# Patient Record
Sex: Female | Born: 1995 | Hispanic: Yes | Marital: Single | Smoking: Never smoker
Health system: Southern US, Community
[De-identification: ages and names within clinical notes are randomized; demographics above are authoritative.]

## PROBLEM LIST (undated history)

## (undated) DIAGNOSIS — O321XX0 Maternal care for breech presentation, not applicable or unspecified: Secondary | ICD-10-CM

---

## 2020-12-06 ENCOUNTER — Inpatient Hospital Stay: Payer: Self-pay | Admitting: Anesthesiology

## 2020-12-06 ENCOUNTER — Encounter: Payer: Self-pay | Admitting: Obstetrics and Gynecology

## 2020-12-06 ENCOUNTER — Encounter
Admission: EM | Disposition: A | Discharge status: Home / Self Care | Payer: Self-pay | Source: Home / Self Care | Attending: Obstetrics and Gynecology

## 2020-12-06 ENCOUNTER — Inpatient Hospital Stay
Admission: EM | Admit: 2020-12-06 | Discharge: 2020-12-14 | DRG: 787 | Disposition: A | Discharge status: Home / Self Care | Payer: Self-pay | Attending: Obstetrics and Gynecology | Admitting: Obstetrics and Gynecology

## 2020-12-06 ENCOUNTER — Other Ambulatory Visit: Payer: Self-pay

## 2020-12-06 DIAGNOSIS — Z3A35 35 weeks gestation of pregnancy: Secondary | ICD-10-CM

## 2020-12-06 DIAGNOSIS — Z23 Encounter for immunization: Secondary | ICD-10-CM

## 2020-12-06 DIAGNOSIS — O34211 Maternal care for low transverse scar from previous cesarean delivery: Secondary | ICD-10-CM | POA: Diagnosis present

## 2020-12-06 DIAGNOSIS — R531 Weakness: Secondary | ICD-10-CM | POA: Diagnosis not present

## 2020-12-06 DIAGNOSIS — O34219 Maternal care for unspecified type scar from previous cesarean delivery: Secondary | ICD-10-CM | POA: Diagnosis present

## 2020-12-06 DIAGNOSIS — Z20822 Contact with and (suspected) exposure to covid-19: Secondary | ICD-10-CM | POA: Diagnosis present

## 2020-12-06 DIAGNOSIS — Z9889 Other specified postprocedural states: Secondary | ICD-10-CM

## 2020-12-06 DIAGNOSIS — O9081 Anemia of the puerperium: Secondary | ICD-10-CM | POA: Diagnosis not present

## 2020-12-06 DIAGNOSIS — O99893 Other specified diseases and conditions complicating puerperium: Secondary | ICD-10-CM | POA: Diagnosis not present

## 2020-12-06 DIAGNOSIS — D62 Acute posthemorrhagic anemia: Secondary | ICD-10-CM | POA: Diagnosis not present

## 2020-12-06 DIAGNOSIS — Z711 Person with feared health complaint in whom no diagnosis is made: Secondary | ICD-10-CM

## 2020-12-06 DIAGNOSIS — M21379 Foot drop, unspecified foot: Secondary | ICD-10-CM | POA: Diagnosis not present

## 2020-12-06 DIAGNOSIS — R200 Anesthesia of skin: Secondary | ICD-10-CM | POA: Diagnosis not present

## 2020-12-06 DIAGNOSIS — O42913 Preterm premature rupture of membranes, unspecified as to length of time between rupture and onset of labor, third trimester: Principal | ICD-10-CM | POA: Diagnosis present

## 2020-12-06 LAB — RESP PANEL BY RT-PCR (FLU A&B, COVID) ARPGX2
Influenza A by PCR: NEGATIVE
Influenza B by PCR: NEGATIVE
SARS Coronavirus 2 by RT PCR: NEGATIVE

## 2020-12-06 LAB — CBC
HCT: 29.2 % — ABNORMAL LOW (ref 36.0–46.0)
Hemoglobin: 9.7 g/dL — ABNORMAL LOW (ref 12.0–15.0)
MCH: 30.4 pg (ref 26.0–34.0)
MCHC: 33.2 g/dL (ref 30.0–36.0)
MCV: 91.5 fL (ref 80.0–100.0)
Platelets: 287 10*3/uL (ref 150–400)
RBC: 3.19 MIL/uL — ABNORMAL LOW (ref 3.87–5.11)
RDW: 12.9 % (ref 11.5–15.5)
WBC: 9.3 10*3/uL (ref 4.0–10.5)
nRBC: 0 % (ref 0.0–0.2)

## 2020-12-06 LAB — OB RESULTS CONSOLE GC/CHLAMYDIA
Chlamydia: NEGATIVE
Gonorrhea: NEGATIVE

## 2020-12-06 LAB — RPR: RPR Ser Ql: NONREACTIVE

## 2020-12-06 LAB — TYPE AND SCREEN
ABO/RH(D): A POS
Antibody Screen: NEGATIVE

## 2020-12-06 LAB — OB RESULTS CONSOLE HIV ANTIBODY (ROUTINE TESTING): HIV: NONREACTIVE

## 2020-12-06 LAB — CHLAMYDIA/NGC RT PCR (ARMC ONLY)
Chlamydia Tr: NOT DETECTED
N gonorrhoeae: NOT DETECTED

## 2020-12-06 LAB — HEPATITIS B SURFACE ANTIGEN: Hepatitis B Surface Ag: NONREACTIVE

## 2020-12-06 LAB — RAPID HIV SCREEN (HIV 1/2 AB+AG)
HIV 1/2 Antibodies: NONREACTIVE
HIV-1 P24 Antigen - HIV24: NONREACTIVE

## 2020-12-06 LAB — GROUP B STREP BY PCR: Group B strep by PCR: NEGATIVE

## 2020-12-06 LAB — ABO/RH: ABO/RH(D): A POS

## 2020-12-06 SURGERY — Surgical Case
Anesthesia: Spinal

## 2020-12-06 MED ORDER — TETANUS-DIPHTH-ACELL PERTUSSIS 5-2.5-18.5 LF-MCG/0.5 IM SUSY
0.5000 mL | PREFILLED_SYRINGE | Freq: Once | INTRAMUSCULAR | Status: DC
  Filled 2020-12-06: qty 0.5

## 2020-12-06 MED ORDER — ACETAMINOPHEN 500 MG PO TABS: 1000.0000 mg | ORAL_TABLET | Freq: Four times a day (QID) | ORAL | Status: DC | PRN

## 2020-12-06 MED ORDER — ONDANSETRON HCL 4 MG/2ML IJ SOLN: 4.0000 mg | Freq: Four times a day (QID) | INTRAMUSCULAR | Status: DC | PRN

## 2020-12-06 MED ORDER — SODIUM CHLORIDE 0.9 % IV SOLN
INTRAVENOUS | Status: DC | PRN
  Administered 2020-12-06: 110 mL

## 2020-12-06 MED ORDER — ACETAMINOPHEN 500 MG PO TABS
1000.0000 mg | ORAL_TABLET | Freq: Four times a day (QID) | ORAL | Status: DC
  Administered 2020-12-06 – 2020-12-14 (×25): 1000 mg via ORAL
  Filled 2020-12-06 (×28): qty 2

## 2020-12-06 MED ORDER — LIDOCAINE HCL (PF) 1 % IJ SOLN: 30.0000 mL | INTRAMUSCULAR | Status: DC | PRN

## 2020-12-06 MED ORDER — OXYTOCIN-SODIUM CHLORIDE 30-0.9 UT/500ML-% IV SOLN
2.5000 [IU]/h | INTRAVENOUS | Status: DC
  Administered 2020-12-06: 30 [IU] via INTRAVENOUS
  Filled 2020-12-06: qty 1000

## 2020-12-06 MED ORDER — IBUPROFEN 800 MG PO TABS
800.0000 mg | ORAL_TABLET | Freq: Four times a day (QID) | ORAL | Status: DC
  Administered 2020-12-07 – 2020-12-14 (×24): 800 mg via ORAL
  Filled 2020-12-06 (×26): qty 1

## 2020-12-06 MED ORDER — DIBUCAINE (PERIANAL) 1 % EX OINT: 1.0000 "application " | TOPICAL_OINTMENT | CUTANEOUS | Status: DC | PRN

## 2020-12-06 MED ORDER — LACTATED RINGERS IV SOLN
INTRAVENOUS | Status: DC
Start: 2020-12-06 — End: 2020-12-06

## 2020-12-06 MED ORDER — LACTATED RINGERS IV SOLN: 500.0000 mL | INTRAVENOUS | Status: DC | PRN

## 2020-12-06 MED ORDER — BUPIVACAINE IN DEXTROSE 0.75-8.25 % IT SOLN
INTRATHECAL | Status: DC | PRN
  Administered 2020-12-06: 1.6 mL via INTRATHECAL

## 2020-12-06 MED ORDER — OXYTOCIN-SODIUM CHLORIDE 30-0.9 UT/500ML-% IV SOLN
2.5000 [IU]/h | INTRAVENOUS | Status: AC
  Administered 2020-12-06 – 2020-12-07 (×2): 2.5 [IU]/h via INTRAVENOUS
  Filled 2020-12-06 (×2): qty 500

## 2020-12-06 MED ORDER — SENNOSIDES-DOCUSATE SODIUM 8.6-50 MG PO TABS
2.0000 | ORAL_TABLET | Freq: Every day | ORAL | Status: DC
  Administered 2020-12-07 – 2020-12-13 (×4): 2 via ORAL
  Filled 2020-12-06 (×5): qty 2

## 2020-12-06 MED ORDER — SIMETHICONE 80 MG PO CHEW
80.0000 mg | CHEWABLE_TABLET | ORAL | Status: DC | PRN
  Administered 2020-12-14: 80 mg via ORAL

## 2020-12-06 MED ORDER — OXYTOCIN BOLUS FROM INFUSION: 333.0000 mL | Freq: Once | INTRAVENOUS | Status: DC

## 2020-12-06 MED ORDER — BUPIVACAINE LIPOSOME 1.3 % IJ SUSP
20.0000 mL | Freq: Once | INTRAMUSCULAR | Status: DC
  Filled 2020-12-06: qty 20

## 2020-12-06 MED ORDER — METHYLERGONOVINE MALEATE 0.2 MG/ML IJ SOLN
INTRAMUSCULAR | Status: AC
  Filled 2020-12-06: qty 1

## 2020-12-06 MED ORDER — WITCH HAZEL-GLYCERIN EX PADS: 1.0000 "application " | MEDICATED_PAD | CUTANEOUS | Status: DC | PRN

## 2020-12-06 MED ORDER — MORPHINE SULFATE (PF) 2 MG/ML IV SOLN
1.0000 mg | INTRAVENOUS | Status: DC | PRN
  Administered 2020-12-06: 2 mg via INTRAVENOUS
  Filled 2020-12-06: qty 1

## 2020-12-06 MED ORDER — DIPHENHYDRAMINE HCL 25 MG PO CAPS: 25.0000 mg | ORAL_CAPSULE | Freq: Four times a day (QID) | ORAL | Status: DC | PRN

## 2020-12-06 MED ORDER — FENTANYL CITRATE (PF) 100 MCG/2ML IJ SOLN
INTRAMUSCULAR | Status: AC
  Filled 2020-12-06: qty 2

## 2020-12-06 MED ORDER — SODIUM CHLORIDE 0.9 % IV SOLN
500.0000 mg | INTRAVENOUS | Status: DC
  Administered 2020-12-06: 500 mg via INTRAVENOUS
  Filled 2020-12-06 (×3): qty 500

## 2020-12-06 MED ORDER — PRENATAL MULTIVITAMIN CH
1.0000 | ORAL_TABLET | Freq: Every day | ORAL | Status: DC
  Administered 2020-12-07 – 2020-12-14 (×7): 1 via ORAL
  Filled 2020-12-06 (×7): qty 1

## 2020-12-06 MED ORDER — COCONUT OIL OIL
1.0000 "application " | TOPICAL_OIL | Status: DC | PRN
  Filled 2020-12-06: qty 120

## 2020-12-06 MED ORDER — SOD CITRATE-CITRIC ACID 500-334 MG/5ML PO SOLN
30.0000 mL | ORAL | Status: AC
  Administered 2020-12-06: 30 mL via ORAL
  Filled 2020-12-06: qty 15

## 2020-12-06 MED ORDER — FENTANYL CITRATE (PF) 100 MCG/2ML IJ SOLN
INTRAMUSCULAR | Status: DC | PRN
  Administered 2020-12-06: 20 ug via INTRATHECAL

## 2020-12-06 MED ORDER — ENOXAPARIN SODIUM 80 MG/0.8ML ~~LOC~~ SOLN
62.5000 mg | SUBCUTANEOUS | Status: DC
  Administered 2020-12-07 – 2020-12-13 (×7): 62.5 mg via SUBCUTANEOUS
  Filled 2020-12-06 (×9): qty 0.8

## 2020-12-06 MED ORDER — BUPIVACAINE HCL (PF) 0.5 % IJ SOLN
INTRAMUSCULAR | Status: AC
  Filled 2020-12-06: qty 30

## 2020-12-06 MED ORDER — OXYCODONE HCL 5 MG PO TABS
5.0000 mg | ORAL_TABLET | ORAL | Status: DC | PRN
  Administered 2020-12-10 – 2020-12-11 (×2): 5 mg via ORAL
  Filled 2020-12-06 (×2): qty 1

## 2020-12-06 MED ORDER — SIMETHICONE 80 MG PO CHEW
80.0000 mg | CHEWABLE_TABLET | Freq: Three times a day (TID) | ORAL | Status: DC
  Administered 2020-12-06 – 2020-12-14 (×8): 80 mg via ORAL
  Filled 2020-12-06 (×11): qty 1

## 2020-12-06 MED ORDER — KETOROLAC TROMETHAMINE 30 MG/ML IJ SOLN
30.0000 mg | Freq: Four times a day (QID) | INTRAMUSCULAR | Status: AC
  Administered 2020-12-06 – 2020-12-07 (×3): 30 mg via INTRAVENOUS
  Filled 2020-12-06 (×4): qty 1

## 2020-12-06 MED ORDER — SODIUM CHLORIDE 0.9% FLUSH
50.0000 mL | Freq: Once | INTRAVENOUS | Status: DC
  Filled 2020-12-06: qty 51

## 2020-12-06 MED ORDER — CARBOPROST TROMETHAMINE 250 MCG/ML IM SOLN
INTRAMUSCULAR | Status: AC
  Filled 2020-12-06: qty 1

## 2020-12-06 MED ORDER — MENTHOL 3 MG MT LOZG
1.0000 | LOZENGE | OROMUCOSAL | Status: DC | PRN
  Filled 2020-12-06: qty 9

## 2020-12-06 MED ORDER — ZOLPIDEM TARTRATE 5 MG PO TABS: 5.0000 mg | ORAL_TABLET | Freq: Every evening | ORAL | Status: DC | PRN

## 2020-12-06 MED ORDER — CEFAZOLIN SODIUM-DEXTROSE 2-4 GM/100ML-% IV SOLN
2.0000 g | INTRAVENOUS | Status: AC
  Administered 2020-12-06: 2 g via INTRAVENOUS
  Filled 2020-12-06: qty 100

## 2020-12-06 MED ORDER — ONDANSETRON HCL 4 MG/2ML IJ SOLN
INTRAMUSCULAR | Status: DC | PRN
  Administered 2020-12-06: 4 mg via INTRAVENOUS

## 2020-12-06 MED ORDER — GABAPENTIN 300 MG PO CAPS
300.0000 mg | ORAL_CAPSULE | Freq: Every day | ORAL | Status: DC
  Administered 2020-12-06 – 2020-12-14 (×9): 300 mg via ORAL
  Filled 2020-12-06 (×9): qty 1

## 2020-12-06 SURGICAL SUPPLY — 28 items
BARRIER ADHS 3X4 INTERCEED (GAUZE/BANDAGES/DRESSINGS) ×2 IMPLANT
CANISTER SUCT 3000ML PPV (MISCELLANEOUS) ×2 IMPLANT
CHLORAPREP W/TINT 26 (MISCELLANEOUS) ×2 IMPLANT
COVER WAND RF STERILE (DRAPES) ×2 IMPLANT
DRSG TELFA 3X8 NADH (GAUZE/BANDAGES/DRESSINGS) ×2 IMPLANT
ELECT CAUTERY BLADE 6.4 (BLADE) ×2 IMPLANT
ELECT REM PT RETURN 9FT ADLT (ELECTROSURGICAL) ×2
ELECTRODE REM PT RTRN 9FT ADLT (ELECTROSURGICAL) ×1 IMPLANT
GAUZE SPONGE 4X4 12PLY STRL (GAUZE/BANDAGES/DRESSINGS) ×2 IMPLANT
GLOVE SURG SYN 8.0 (GLOVE) ×2 IMPLANT
GOWN STRL REUS W/ TWL LRG LVL3 (GOWN DISPOSABLE) ×2 IMPLANT
GOWN STRL REUS W/ TWL XL LVL3 (GOWN DISPOSABLE) ×1 IMPLANT
GOWN STRL REUS W/TWL LRG LVL3 (GOWN DISPOSABLE) ×2
GOWN STRL REUS W/TWL XL LVL3 (GOWN DISPOSABLE) ×1
MANIFOLD NEPTUNE II (INSTRUMENTS) ×2 IMPLANT
NEEDLE HYPO 22GX1.5 SAFETY (NEEDLE) ×2 IMPLANT
NS IRRIG 1000ML POUR BTL (IV SOLUTION) ×2 IMPLANT
PACK C SECTION AR (MISCELLANEOUS) ×2 IMPLANT
PAD OB MATERNITY 4.3X12.25 (PERSONAL CARE ITEMS) ×2 IMPLANT
PAD PREP 24X41 OB/GYN DISP (PERSONAL CARE ITEMS) ×2 IMPLANT
STRAP SAFETY 5IN WIDE (MISCELLANEOUS) ×2 IMPLANT
SUCT VACUUM KIWI BELL (SUCTIONS) ×2 IMPLANT
SUT CHROMIC 1 CTX 36 (SUTURE) ×6 IMPLANT
SUT PLAIN GUT 0 (SUTURE) ×4 IMPLANT
SUT VIC AB 0 CT1 36 (SUTURE) ×4 IMPLANT
SYR 30ML LL (SYRINGE) ×4 IMPLANT
TAPE MEDIFIX FOAM 3 (GAUZE/BANDAGES/DRESSINGS) ×2 IMPLANT
TUBE CONNECTING 6X3/16 (MISCELLANEOUS) ×2 IMPLANT

## 2020-12-06 NOTE — OB Triage Note (Signed)
Pt is a G3P1 from [place]. Pt has been in [place] for 2 days visiting family. Pt reports LOF around 4:00a. Pt report no bleeding, no N/VD. Pt is schedule for a C-section on Feb 2. Pt has had a pervious C-section in [place].

## 2020-12-06 NOTE — Progress Notes (Signed)
Dr. Pernell Dupre notified that Pt. States from the ankle down her feet are, "numb" bilat. Pt. Had stated she needed to have a B.M and when we attempted to assist her to the [facility], Pt. C/o numbness in feet. She can lift legs, move toes, flex and extend feet. Pedal pushes are equal ans sl. Weak. Positive pedal pulses equal and strong. Dr. Pernell Dupre states Pt. Is to be monitored for improvement by morning and if she still has the c/o numbness, Day Shift Anesthesia are to be notified.

## 2020-12-06 NOTE — Progress Notes (Signed)
   12/06/20 1638  Provider Notification  Provider Name/Title Pernell Dupre, MD  Method of Notification Phone  Request Evaluate - remote  Notification Reason Order request (pain, order verification)    Dr. Pernell Dupre, Anesthesiologist confirmed that the patient is able to have both Morphine and Roxicodone as ordered at any time.

## 2020-12-06 NOTE — Op Note (Signed)
NAME: Robyn Dunn, Robyn Dunn MEDICAL RECORD PI:[id number] ACCOUNT [id number] DATE OF BIRTH:08-07-1996 FACILITY: [facility] LOCATION: [facility] PHYSICIAN:Symphani Eckstrom Cloyde Reams, MD  OPERATIVE REPORT  DATE OF PROCEDURE:  12/06/2020  PREOPERATIVE DIAGNOSES: 1.  35+1 weeks estimated gestational age. 2.  Premature preterm rupture of membranes. 3.  Prior cesarean section, desires elective repeat.  POSTOPERATIVE DIAGNOSES: 1.  35+1 weeks estimated gestational age. 2.  Premature preterm rupture of membranes. 3.  Prior cesarean section, desires elective repeat. 4.  Vigorous female delivered.  PROCEDURE:  Repeat low transverse cesarean section.  ANESTHESIA:  Spinal.  SURGEON:  Jennell Corner, MD  FIRST ASSISTANT:  Margaretmary Eddy, certified nurse midwife.  INDICATIONS:  A 25 year old, gravida 3, para 1, at 35+1 weeks.  The patient is traveling from out of state visiting in [place] when she had spontaneous rupture of membranes at 0400 the day of the procedure.  The patient with a prior cesarean  section and has previously decided on elective repeat cesarean section and reconfirmed that desire the day of the procedure.  The patient denies opting for sterilization.  DESCRIPTION OF PROCEDURE:  After adequate spinal anesthesia, the patient was placed in dorsal supine position, hip roll on the right side.  The patient's abdomen was prepped and draped in normal sterile fashion.  The patient previously receive 2 grams IV  Ancef and 500 mg azithromycin for surgical prophylaxis.  Timeout was performed.  A Pfannenstiel incision was made 2 fingerbreadths above the symphysis pubis.  Sharp dissection was used to identify the fascia.  Fascia was opened in the midline and opened  in a transverse fashion.  Superior aspect of the fascia was grasped with Kocher clamps, and the recti muscles were dissected free.  Inferior aspect of the fascia was grasped with Kocher clamps and pyramidalis muscle was  dissected free.  Entry into the  peritoneal cavity was accomplished sharply.  There was no scar tissue identified.  The vesicouterine peritoneal fold was identified, opened, and the bladder was reflected inferiorly.  Low transverse uterine incision was made.  Upon entry into the  endometrial cavity, clear fluid resulted.  The incision was extended with blunt transverse traction.  A Kiwi bell vacuum was applied to the fetal occiput, and with one gentle pull, the head was delivered.  The vacuum was removed.  Shoulders and body were  delivered without difficulty.  A vigorous female was then dried on the abdomen for 60 seconds, and the cord was doubly clamped and infant was passed to nursery staff who assigned Apgar scores of 8 and 9.  Fetal weight 2250 grams.  Time of delivery 1149  on two 12/06/2020.  The placenta was manually delivered, and the uterus was exteriorized.  The endometrial cavity was wiped clean with laparotomy tape.  Uterine incision was then reapproximated with 1 chromic suture in a running locking fashion.  Good  approximation of edges.  Good hemostasis was noted.  Fallopian tubes and ovaries appeared normal.  Posterior cul-de-sac was irrigated and suctioned.  The uterus was placed back into the abdominal cavity and the pericolic gutters were wiped clean with  laparotomy tape.  Uterine incision again appeared hemostatic and Interceed was placed over the uterine incision in a T-shaped fashion.  Fascia was then closed with 0 Vicryl suture in a running nonlocking fashion.  Good approximation of edges.  Good  hemostasis was noted.  Subfascial edges were then injected with a solution of 20 mL of 1.3% Exparel plus 30 mL of 0.5% Marcaine and 50  mL normal saline.  40 mL of the solution was injected in the fascial plane.  Subcutaneous tissues were irrigated and  bovied for hemostasis, and given the subcutaneous tissues depth of 3 cm, the dead space was closed with a running 2-0 chromic suture.  Skin  was reapproximated with Insorb absorbable staples.  Good cosmetic effect.  Sterile dressing applied.  COMPLICATIONS:  There were no complications.  QUANTITATIVE BLOOD LOSS:  180 mL.  INTRAOPERATIVE FLUIDS:  500 mL.  URINE OUTPUT:  150 mL.  DISPOSITION:  The patient was taken to recovery room in good condition.  IN/NUANCE  D:12/06/2020 T:12/06/2020 JOB:[id number]

## 2020-12-06 NOTE — Progress Notes (Signed)
Patient tolerating crackers and water and peanut butter. Advanced to regular diet per order.

## 2020-12-06 NOTE — Brief Op Note (Signed)
12/06/2020  12:47 PM  PATIENT:  Robyn Dunn  24 y.o. female  PRE-OPERATIVE DIAGNOSIS:  Repeat Cesarean Section 35+1 weeks   PPROM  POST-OPERATIVE DIAGNOSIS:  Repeat Cesarean Section  PROCEDURE:  Procedure(s): CESAREAN SECTION LTCS SURGEON:  Surgeon(s) and Role:    * Henrine Hayter, Ihor Austin, MD - Primary  PHYSICIAN ASSISTANT: Mackey , CNM   ASSISTANTS: none   ANESTHESIA:   spinal  EBL:  QBL 180 mL   BLOOD ADMINISTERED:none  DRAINS: Urinary Catheter (Foley)   LOCAL MEDICATIONS USED:  MARCAINE    and BUPIVICAINE   SPECIMEN:  No Specimen  DISPOSITION OF SPECIMEN:  N/A  COUNTS:  YES  TOURNIQUET:  * No tourniquets in log *  DICTATION: .Other Dictation: Dictation Number verbal  PLAN OF CARE: Admit to inpatient   PATIENT DISPOSITION:  PACU - hemodynamically stable.   Delay start of Pharmacological VTE agent (>24hrs) due to surgical blood loss or risk of bleeding: not applicable

## 2020-12-06 NOTE — Transfer of Care (Signed)
Immediate Anesthesia Transfer of Care Note  Patient: Robyn Dunn  Procedure(s) Performed: CESAREAN SECTION  Patient Location: PACU and Nursing Unit  Anesthesia Type:Spinal  Level of Consciousness: awake  Airway & Oxygen Therapy: Patient Spontanous Breathing  Post-op Assessment: Report given to RN  Post vital signs: stable  Last Vitals:  Vitals Value Taken Time  BP    Temp    Pulse    Resp    SpO2      Last Pain:  Vitals:   12/06/20 0707  TempSrc: Oral  PainSc: 0-No pain         Complications: No complications documented.

## 2020-12-06 NOTE — Progress Notes (Signed)
Patient ID: Robyn Dunn, female   DOB: 1996-06-18, 25 y.o.   MRN: [id number] 35+1 weeks   Unassigned pt with ROM grossly at 0400 . Previous c/s and scheduled for repeat .Pt denies complications in this pregnancy .  Cesarean section risks and indications discuss with pt and translator present . All questions answered . Proceed . Peds has talked to her .

## 2020-12-06 NOTE — Discharge Summary (Signed)
Obstetrical Discharge Summary  Patient Name: Robyn Dunn DOB: 09/27/1996 MRN: [id number]  Date of Admission: 12/06/2020 Date of Delivery: 12/06/20 Delivered by: Huel Cote MD Date of Discharge:12/21/20  Primary OB: out of state - [place]  GBT:DVVOHYW'V last menstrual period was 04/04/2020. EDC Estimated Date of Delivery: 01/09/21 Gestational Age at Delivery: [redacted]w[redacted]d   Antepartum complications:none listed  Admitting Diagnosis: PPROM , prior c/s , desires repeat LTCS  Secondary Diagnosis:  Patient Active Problem List   Diagnosis Date Noted  . Person with feared complaint in whom no diagnosis is made 12/09/2020  . Preterm premature rupture of membranes in third trimester 12/06/2020  . Labor and delivery indication for care or intervention 12/06/2020  . Previous cesarean delivery affecting pregnancy 12/06/2020  . Postoperative state 12/06/2020    Augmentation: N/A Complications: None Intrapartum complications/course: Tnia presented to L&D with complaints of LOF.  She was grossly ruptured with large amount of clear fluid.  An elective repeat LTCS was performed by Dr. Ouida Sills.  See OP note for further details.    Date of Delivery: 12/06/2020 Delivered By: Huel Cote MD Delivery Type:repeat LTCS, vacuum assisted  Anesthesia: epidural Placenta: spontaneous Laceration: none  Episiotomy: none Newborn Data: Live born female  Birth Weight: 4 lb 15.4 oz (2250 g) APGAR: 8, 9  Newborn Delivery   Birth date/time: 12/06/2020 11:49:00 Delivery type: C-Section, Vacuum Assisted Trial of labor: No C-section categorization: Repeat     Postpartum Procedures: see below  Edinburgh:  Edinburgh Postnatal Depression Scale Screening Tool 12/13/2020 12/06/2020  I have been able to laugh and see the funny side of things. 1 0  I have looked forward with enjoyment to things. 1 0  I have blamed myself unnecessarily when things went wrong. 1 2  I have been anxious or worried for no  good reason. 1 2  I have felt scared or panicky for no good reason. 1 1  Things have been getting on top of me. 2 2  I have been so unhappy that I have had difficulty sleeping. 2 0  I have felt sad or miserable. 2 1  I have been so unhappy that I have been crying. 2 1  The thought of harming myself has occurred to me. 0 0  Edinburgh Postnatal Depression Scale Total 13 9    Post partum course: Complicated Patient had a complicated postpartum course.  Pt complained postop of bilateral LE weakness and numbness. Her postop course was uncomplicated from a obstetric standpoint. No trouble with toileting.  POD#1 Neurology was consulted on POD#1. They ordered an MRI, CMP, B12 and MMA, and gave empiric B12. MRI returned no acute findings in the spine.  Anesthesia also evaluated and found no anesthesia explanation for her sx.  POD#2, 12/08/20 Social work and physical therapy were consulted on POD#2. Neurology workup significantly more involved (please see Dr. Theda Sers note from 12/08/20)  POD#3, 12/09/20 Occupational, physical therapy involved. Social work and transition of care involved. Psychiatry consulted, found no concerns, and signed off  POD#4-8 Pt continued with daily physical and occupational therapy. Neuro work up negative. No lumbar puncture needed. Labs pending: ANA, CRP, ACE, homocysteine, serum copper, thiamine, vitamin E, anti-tissue transglutaminase antibodies,Lyme, heavy metal assay, anti-GQ1b IgG antibody, SPEP with immunofixation. They suggested inpatient rehab, but no bed available for 1-2 weeks. They were unable to find a diagnosis during this hospitalization.   By time of discharge on POD#8, she is able to ambulate with a walker. A wheel chair is needed intermittently,  and she will discharge home with one. She is d/c home with sister, planning to drive to her home in [place]. She has braces for foot drop.   Pt would benefit from inpatient rehab. However, this option in  limited as no beds available. Her course is limited by: no insurance, which limits her bed options; that she is far from home (PPROM at 35 weeks, all obstetric care and family resources in [place]); language barrier as she is Spanish-speaking only. It appears that her family is driving to [place] from [place] to collect her tonight, with the plan to drive her to [place]. This is not ideal, but no discharge planning at this point is ideal. She is adamant about leaving tonight, and it sounds like she has the family support to maintain her safety. A release of information was signed for her primary OB so that we can call them tomorrow to transition care, and make sure they can help her with home health resources.  Prior to discharge, she received the varicella vaccine.  Discharge Physical Exam:  BP 107/66 (BP Location: Left Arm)   Pulse 67   Temp 98.2 F (36.8 C) (Oral)   Resp 16   Ht 5\' 9"  (1.753 m)   Wt 123.4 kg   LMP 04/04/2020   SpO2 99%   Breastfeeding Unknown   BMI 40.17 kg/m   General: NAD CV: RRR Pulm: CTABL, nl effort ABD: s/nd/nt, fundus firm and below the umbilicus Lochia: moderate Incision: c/d/i DVT Evaluation: LE non-ttp, no evidence of DVT on exam.  Hemoglobin  Date Value Ref Range Status  12/13/2020 9.7 (L) 12.0 - 15.0 g/dL Final   HCT  Date Value Ref Range Status  12/13/2020 29.6 (L) 36.0 - 46.0 % Final     Disposition: stable, discharge to home. Baby Feeding: formula Baby Disposition: home with mom  Rh Immune globulin given: n/s Rubella vaccine given: n/a Varicella vaccine given: Varivax received prior to discharge   Contraception: Undecided - will discuss with primary OB in 02/10/2021   Prenatal Labs:  Blood type/Rh A Pos  Antibody screen neg  Rubella immune   Varicella Non-immune per prenatal records   RPR Non reactive  HBsAg Negative   HIV nonreactive   GC Negative    Chlamydia negative   Genetic screening cfDNA negative  1 hour GTT Unknown  3 hour GTT  Unknown  GBS Negative     Plan:  Lien Lyman was discharged to home in good condition. Follow-up appointment with primary OB provider in 2 weeks, will need home health as above. Return to primary OB for postpartum visit in 6 weeks.  Discharge Medications: Allergies as of 12/14/2020   No Known Allergies     Medication List    TAKE these medications   acetaminophen 500 MG tablet Commonly known as: TYLENOL Take 2 tablets (1,000 mg total) by mouth every 6 (six) hours.   gabapentin 300 MG capsule Commonly known as: NEURONTIN Take 1 capsule (300 mg total) by mouth at bedtime.   ibuprofen 800 MG tablet Commonly known as: ADVIL Take 1 tablet (800 mg total) by mouth every 6 (six) hours.   oxyCODONE 5 MG immediate release tablet Commonly known as: Oxy IR/ROXICODONE Take 1-2 tablets (5-10 mg total) by mouth every 4 (four) hours as needed for moderate pain.   prenatal multivitamin Tabs tablet Take 1 tablet by mouth daily at 12 noon.        Follow-up Information    Schermerhorn,  Ihor Austin, MD Follow up in 2 week(s).   Specialty: Obstetrics and Gynecology Why: for postop if you can Contact information: [address] [place] [address] [phone]               Signed:  Margaretmary Eddy, CNM Certified Nurse Midwife [place]  [facility]

## 2020-12-06 NOTE — Lactation Note (Addendum)
This note was copied from a baby's chart. Lactation Consultation Note  Patient Name: Robyn Dunn [id number] Date: 12/06/2020 Reason for consult: Initial assessment;Difficult latch;Late-preterm 34-36.6wks;Infant < 6lbs;Other (Comment) (Mom has just been giving bottles of formula) Age:24 hours  Mom has been choosing to give bottles of formula since her C/S.  Geradine was delivered via C/S at 35.1.  Mom is from [place], [place] and was here visiting when her membranes ruptured.  Mom had just given 15 ml via bottle of Similac formula.  Mom was convinced she did not have any milk, even though mom hand expressed 4 ml which was spoon fed to baby.  Encouraged mom to hand express some colostrum after breast feeding to rub on nipples to lubricate, prevent bacteria and help with tenderness.  Mom breast fed her first baby for 1 month and reports having very little milk.  Explained supply and demand and the need for frequent stimulation of the breast to bring in mature milk and ensure a plentiful milk supply.  Discussed feeding cues encouraging mom to put baby to the breast whenever she demonstrated feeding cues.  All education to parents was given via spanish interpreter and all written information was given in [place].  Hand out given on what to expect with feedings the first 4 days of baby's life reviewing normal newborn stomach size, adequate intake and out put, normal course of lactation and routine newborn feeding patterns.  LLL hand out was given and reviewed contact numbers and support groups.  Lactation name and number was written on white board and encouraged mom to call for next feeding.  She did not call, but gave a bottle instead stating she was hurting too much.    Maternal Data Formula Feeding for Exclusion: Yes Reason for exclusion: Mother's choice to formula and breast feed on admission Has patient been taught Hand Expression?: Yes Does the patient have breastfeeding experience prior to  this delivery?: Yes  Feeding Feeding Type: Bottle Fed - Formula Nipple Type: Slow - flow  LATCH Score                   Interventions Interventions: Breast feeding basics reviewed;Hand express;Breast compression;Expressed milk  Lactation Tools Discussed/Used Tools: Other (comment) (Spoon)   Consult Status Consult Status: Follow-up Follow-up type: Call as needed    Louis Meckel 12/06/2020, 8:21 PM

## 2020-12-06 NOTE — H&P (Signed)
OB History & Physical   History of Present Illness:   Chief Complaint: Rupture of membranes  Interpreter used for communication   HPI:  Robyn Dunn is a 25 y.o. G92P1011 female at [redacted]w[redacted]d, EDD per prenatal records.  She presents to L&D for rupture of membranes.  Reports active fetal movement  Contractions: irregular cramping  LOF/SROM: Large gush of clear fluid at 0400 Vaginal bleeding: denies   Pregnancy Issues: 1. Previous c/section, unspecified - midline vertical scar on abdomen  2. Minimal prenatal records available  3. pPROM  Patient Active Problem List   Diagnosis Date Noted  . Preterm premature rupture of membranes in third trimester 12/06/2020  . Labor and delivery indication for care or intervention 12/06/2020  . Previous cesarean delivery affecting pregnancy 12/06/2020     Maternal Medical History:  History reviewed. No pertinent past medical history.  Past Surgical History:  Procedure Laterality Date  . CESAREAN SECTION      No Known Allergies  Prior to Admission medications   Medication Sig Start Date End Date Taking? Authorizing Provider  Prenatal Vit-Fe Fumarate-FA (PRENATAL MULTIVITAMIN) TABS tablet Take 1 tablet by mouth daily at 12 noon.   Yes [provider]     Prenatal care site:  [facility], [place]   Social History: She  reports that she has never smoked. She has never used smokeless tobacco. She reports previous alcohol use. She reports previous drug use.  Family History: family history includes Diabetes in her maternal grandmother and mother. No history of gyn cancers.   Review of Systems: A full review of systems was performed and negative except as noted in the HPI.     Physical Exam:  Vital Signs: BP 130/75 (BP Location: Right Arm)   Pulse 71   Temp 97.9 F (36.6 C) (Oral)   Resp 15   Ht 5\' 9"  (1.753 m)   Wt 123.4 kg   LMP 04/04/2020   BMI 40.17 kg/m  Physical Exam  General: no acute  distress.  HEENT: normocephalic, atraumatic Heart: regular rate & rhythm.  No murmurs/rubs/gallops Lungs: clear to auscultation bilaterally, normal respiratory effort Abdomen: soft, gravid, non-tender;  EFW: 5lbs Pelvic:   External: Normal external female genitalia  Cervix: Dilation: Closed /   /      Extremities: non-tender, symmetric, No edema bilaterally.  DTRs: 2+/2+  Neurologic: Alert & oriented x 3.    Results for orders placed or performed during the hospital encounter of 12/06/20 (from the past 24 hour(s))  CBC     Status: Abnormal   Collection Time: 12/06/20  5:44 AM  Result Value Ref Range   WBC 9.3 4.0 - 10.5 K/uL   RBC 3.19 (L) 3.87 - 5.11 MIL/uL   Hemoglobin 9.7 (L) 12.0 - 15.0 g/dL   HCT 02/03/21 (L) 63.8 - 75.6 %   MCV 91.5 80.0 - 100.0 fL   MCH 30.4 26.0 - 34.0 pg   MCHC 33.2 30.0 - 36.0 g/dL   RDW 43.3 29.5 - 18.8 %   Platelets 287 150 - 400 K/uL   nRBC 0.0 0.0 - 0.2 %  Type and screen [facility]     Status: None   Collection Time: 12/06/20  5:44 AM  Result Value Ref Range   ABO/RH(D) A POS    Antibody Screen NEG    Sample Expiration      12/09/2020,2359 Performed at [facility], [address], [place], [place]  Group B strep by PCR     Status: None   Collection Time: 12/06/20  5:44 AM   Specimen: Vaginal/Rectal; Genital  Result Value Ref Range   Group B strep by PCR NEGATIVE NEGATIVE  Resp Panel by RT-PCR (Flu A&B, Covid) Nasopharyngeal Swab     Status: None   Collection Time: 12/06/20  5:44 AM   Specimen: Nasopharyngeal Swab; Nasopharyngeal(NP) swabs in vial transport medium  Result Value Ref Range   SARS Coronavirus 2 by RT PCR NEGATIVE NEGATIVE   Influenza A by PCR NEGATIVE NEGATIVE   Influenza B by PCR NEGATIVE NEGATIVE  ABO/Rh     Status: None   Collection Time: 12/06/20  6:53 AM  Result Value Ref Range   ABO/RH(D)      A POS Performed at [facility], [address] [place], [place]  [address]     Pertinent Results:  Prenatal Labs: Blood type/Rh A Pos  Antibody screen neg  Rubella Pending   Varicella Non-immune per prenatal records   RPR Pending  HBsAg Pending   HIV Pending   GC Pending   Chlamydia Pending   Genetic screening cfDNA negative  1 hour GTT Unknown  3 hour GTT Unknown  GBS Negative    FHT: Baseline: 140 bpm, Variability: moderate, Accelerations: present and Decelerations: Absent TOCO: irregular, mild contractions  SVE:  Dilation: Closed /   /      Cephalic by [place] and bedside US   No results found.  Assessment:  Robyn Dunn is a 25 y.o. G40P1011 female at [redacted]w[redacted]d with pPROM.   Plan:  1. Admit to Labor & Delivery; consents reviewed and obtained - Covid admission screen negative - Receives care from [facility] in [place].   - Minimal prenatal records available  - Dr. Feliberto Gottron notified of admission, plan for repeat c/section today   2. Fetal Well being  - Fetal Tracing: cat 1 - Group B Streptococcus ppx not indicated: GBS neg - Presentation: cephalic confirmed by [place]   3. Routine OB: - Prenatal labs reviewed, as above - Rh pos - CBC, T&S, RPR on admit - Prenatal labs ordered  - NPO, IVF  4. Repeat cesarean section   - Previous c/section performed in [place] for breech presentation   - Pain control per anesthesia - NPO at 2200 12/05/2020  - PreOp antibiotics ordered for on call to OR  5. Post Partum Planning: - Infant feeding: Breast - Contraception: TBD - Tdap vaccine: Uknown - Flu vaccine: Unknown   Gustavo Lah, CNM 12/06/20 8:56 AM  Margaretmary Eddy, CNM Certified Nurse Midwife [place]  [facility]

## 2020-12-06 NOTE — Plan of Care (Signed)
Video Interpreter Rafael # [phone] Interpreted during Assessment and Education. Pt. Is alert and oriented with pleasant affect. Color good, skin w&d. BS Clear. Demonstrated correct use of IS. Lower Abdominal dressing is D&I. Fundus is firm with Scant Lochia. Tolerating Regular Diet.

## 2020-12-06 NOTE — Anesthesia Procedure Notes (Signed)
Spinal  Patient location during procedure: OR Staffing Performed: anesthesiologist  Preanesthetic Checklist Completed: patient identified, IV checked, site marked, risks and benefits discussed, surgical consent, monitors and equipment checked, pre-op evaluation and timeout performed Spinal Block Patient position: sitting Prep: DuraPrep Patient monitoring: heart rate, cardiac monitor, continuous pulse ox and blood pressure Approach: midline Location: L3-4 Injection technique: single-shot Needle Needle type: Whitacre  Needle gauge: 24 G Needle length: 10 cm Assessment Sensory level: T4 Additional Notes Easy single pass with clear csf.  Tolerated well and vsst.  Able to communicate sufficiently well and answer questions with staff.  [name]

## 2020-12-06 NOTE — Progress Notes (Signed)
PHARMACIST - PHYSICIAN COMMUNICATION  CONCERNING:  Enoxaparin (Lovenox) for DVT Prophylaxis    RECOMMENDATION: Patient was prescribed enoxaprin 40mg  q24 hours for VTE prophylaxis.   Filed Weights   12/06/20 0537  Weight: 123.4 kg (272 lb)    Body mass index is 40.17 kg/m.  CrCl cannot be calculated (No successful lab value found.).   Based on [facility] policy patient is candidate for enoxaparin 0.5mg /kg TBW SQ every 24 hours based on BMI being >30.  DESCRIPTION: Pharmacy has adjusted enoxaparin dose per [facility] policy.  Patient is now receiving enoxaparin 62.5 mg every 24 hours    [facility], PharmD Clinical Pharmacist  12/06/2020 2:17 PM

## 2020-12-06 NOTE — Anesthesia Preprocedure Evaluation (Signed)
Anesthesia Evaluation  Patient identified by MRN, date of birth, ID band Patient awake    Reviewed: Allergy & Precautions, H&P , NPO status , Patient's Chart, lab work & pertinent test results, reviewed documented beta blocker date and time   Airway Mallampati: II  TM Distance: >3 FB Neck ROM: full    Dental no notable dental hx. (+) Teeth Intact   Pulmonary neg pulmonary ROS, Current Smoker,    Pulmonary exam normal breath sounds clear to auscultation       Cardiovascular Exercise Tolerance: Good negative cardio ROS   Rhythm:regular Rate:Normal     Neuro/Psych negative neurological ROS  negative psych ROS   GI/Hepatic negative GI ROS, Neg liver ROS,   Endo/Other  negative endocrine ROSdiabetes  Renal/GU      Musculoskeletal   Abdominal   Peds  Hematology negative hematology ROS (+)   Anesthesia Other Findings   Reproductive/Obstetrics (+) Pregnancy                             Anesthesia Physical Anesthesia Plan  ASA: II and emergent  Anesthesia Plan: Spinal   Post-op Pain Management:    Induction:   PONV Risk Score and Plan: 3  Airway Management Planned:   Additional Equipment:   Intra-op Plan:   Post-operative Plan:   Informed Consent: I have reviewed the patients History and Physical, chart, labs and discussed the procedure including the risks, benefits and alternatives for the proposed anesthesia with the patient or authorized representative who has indicated his/her understanding and acceptance.       Plan Discussed with:   Anesthesia Plan Comments:         Anesthesia Quick Evaluation

## 2020-12-07 ENCOUNTER — Inpatient Hospital Stay: Payer: Self-pay

## 2020-12-07 ENCOUNTER — Encounter: Payer: Self-pay | Admitting: Obstetrics and Gynecology

## 2020-12-07 DIAGNOSIS — R29898 Other symptoms and signs involving the musculoskeletal system: Secondary | ICD-10-CM

## 2020-12-07 LAB — COMPREHENSIVE METABOLIC PANEL
ALT: 15 U/L (ref 0–44)
AST: 20 U/L (ref 15–41)
Albumin: 2.3 g/dL — ABNORMAL LOW (ref 3.5–5.0)
Alkaline Phosphatase: 141 U/L — ABNORMAL HIGH (ref 38–126)
Anion gap: 9 (ref 5–15)
BUN: 10 mg/dL (ref 6–20)
CO2: 24 mmol/L (ref 22–32)
Calcium: 8.5 mg/dL — ABNORMAL LOW (ref 8.9–10.3)
Chloride: 105 mmol/L (ref 98–111)
Creatinine, Ser: 0.48 mg/dL (ref 0.44–1.00)
GFR, Estimated: >60 mL/min (ref >60)
Glucose, Bld: 93 mg/dL (ref 70–99)
Potassium: 3.8 mmol/L (ref 3.5–5.1)
Sodium: 138 mmol/L (ref 135–145)
Total Bilirubin: 0.4 mg/dL (ref 0.3–1.2)
Total Protein: 6.3 g/dL — ABNORMAL LOW (ref 6.5–8.1)

## 2020-12-07 LAB — CBC
HCT: 26.1 % — ABNORMAL LOW (ref 36.0–46.0)
Hemoglobin: 8.3 g/dL — ABNORMAL LOW (ref 12.0–15.0)
MCH: 29.9 pg (ref 26.0–34.0)
MCHC: 31.8 g/dL (ref 30.0–36.0)
MCV: 93.9 fL (ref 80.0–100.0)
Platelets: 207 10*3/uL (ref 150–400)
RBC: 2.78 MIL/uL — ABNORMAL LOW (ref 3.87–5.11)
RDW: 12.8 % (ref 11.5–15.5)
WBC: 9.5 10*3/uL (ref 4.0–10.5)
nRBC: 0 % (ref 0.0–0.2)

## 2020-12-07 LAB — VITAMIN B12: Vitamin B-12: 120 pg/mL — ABNORMAL LOW (ref 180–914)

## 2020-12-07 IMAGING — MR MR LUMBAR SPINE W/O CM
5 series · 31 of 48 positions shown · non-contrast
Comparison: None.

CLINICAL DATA: Myelopathy, acute or progressive. Bilateral leg
weakness/numbness.

EXAM:
MRI TOTAL SPINE WITHOUT
TECHNIQUE: Multisequence MR imaging of the spine from the cervical spine to the
sacrum was performed without IV contrast administration.

[Series 1: T2 · sagittal · 4.0mm · 0.81mm/px · 7 of 17 slices shown (1 of 2)]
[im 1/17]
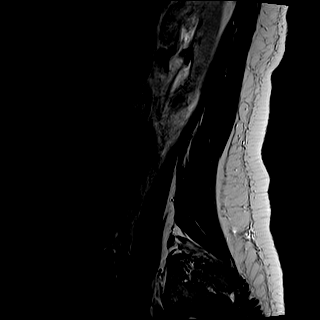
[im 3/17]
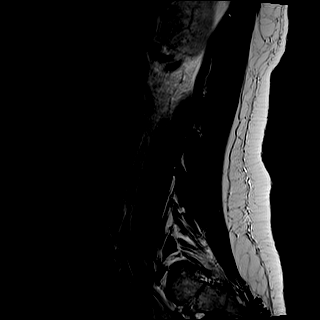
[im 6/17]
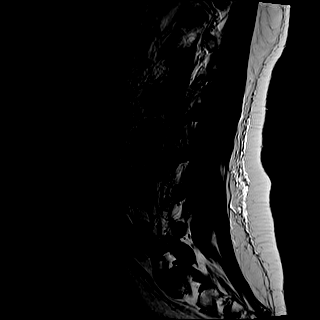
[im 9/17]
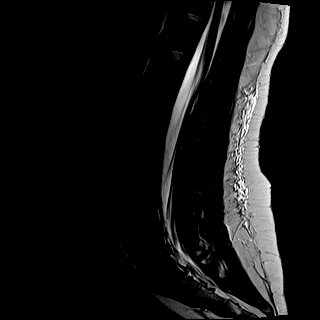
[im 11/17]
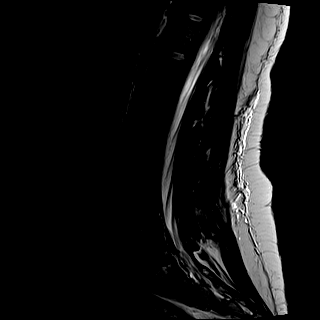
[im 14/17]
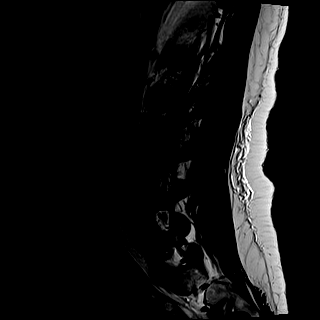
[im 17/17]
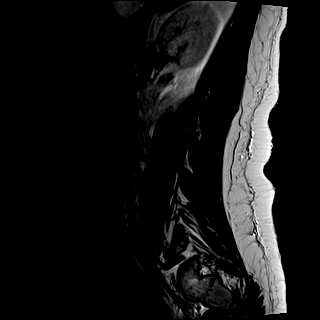

[Series 2: T1 · sagittal · 4.0mm · 0.81mm/px · 7 of 17 slices shown (1 of 2)]
[im 1/17]
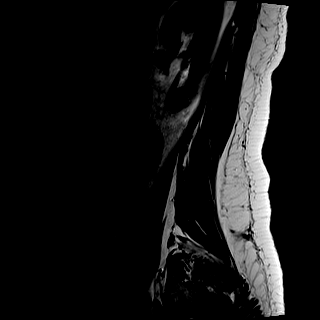
[im 3/17]
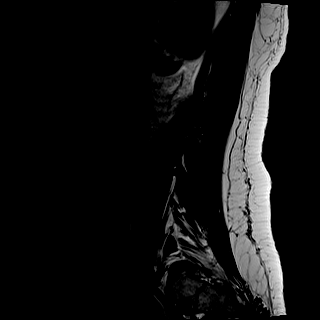
[im 6/17]
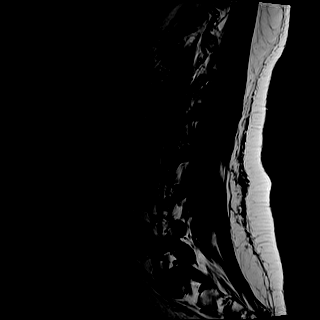
[im 9/17]
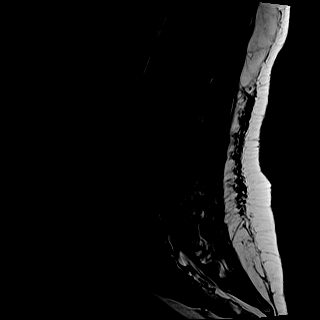
[im 11/17]
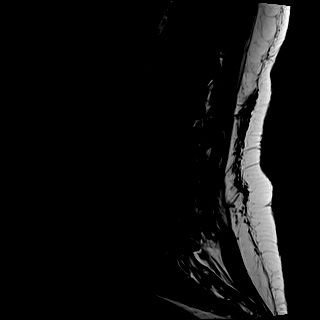
[im 14/17]
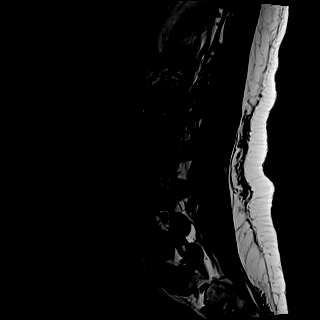
[im 17/17]
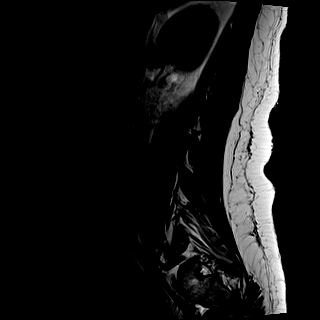

[Series 3: STIR · sagittal · 4.0mm · 0.41mm/px · 1 of 17 slices shown]
[im 1/17]
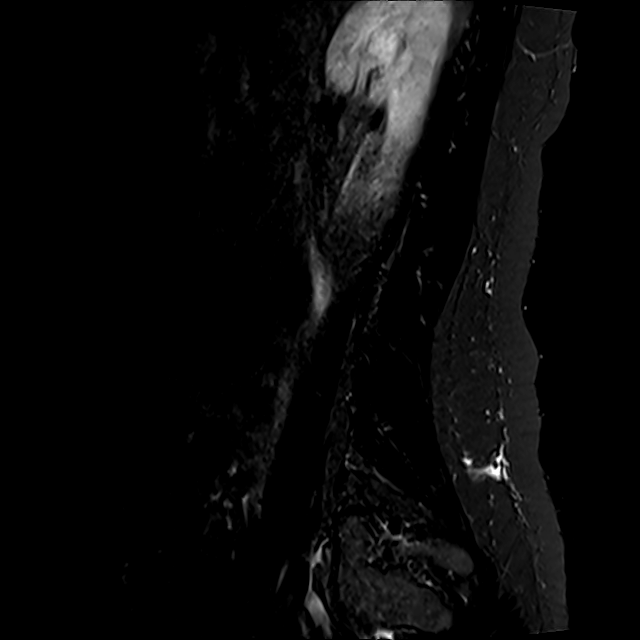

[Series 4: T2 · axial · 4.0mm · 0.78mm/px · z∈[-610,-391]mm · 8 of 38 slices shown (2 of 2)]
[im 1/38]
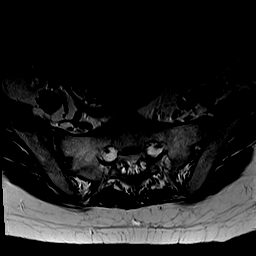
[im 6/38]
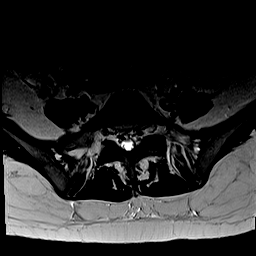
[im 12/38]
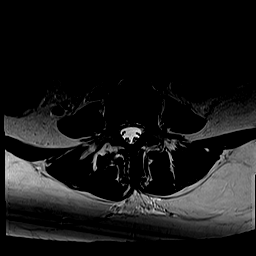
[im 18/38]
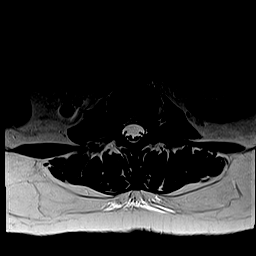
[im 20/38]
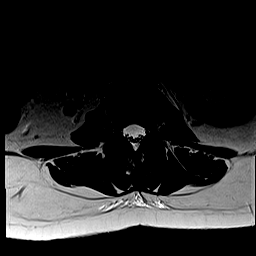
[im 26/38]
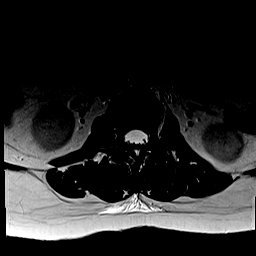
[im 32/38]
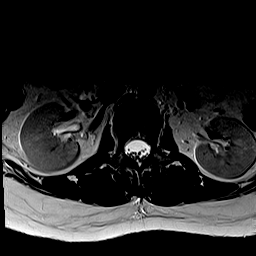
[im 38/38]
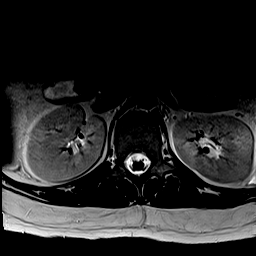

[Series 5: T1 · axial · 4.0mm · 0.39mm/px · z∈[-610,-391]mm · 8 of 38 slices shown (2 of 2)]
[im 1/38]
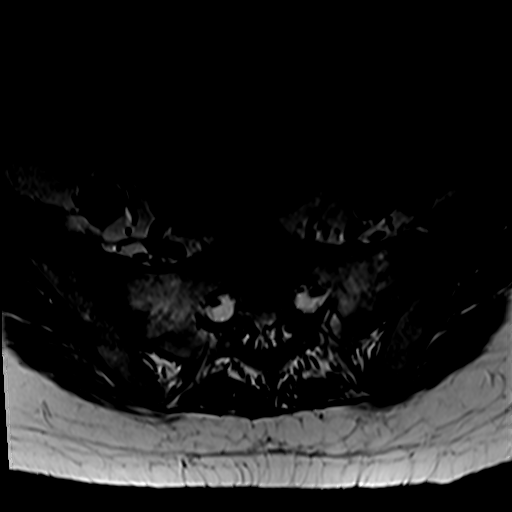
[im 6/38]
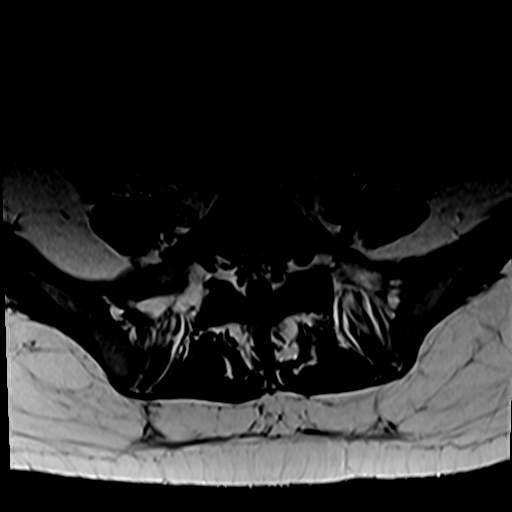
[im 12/38]
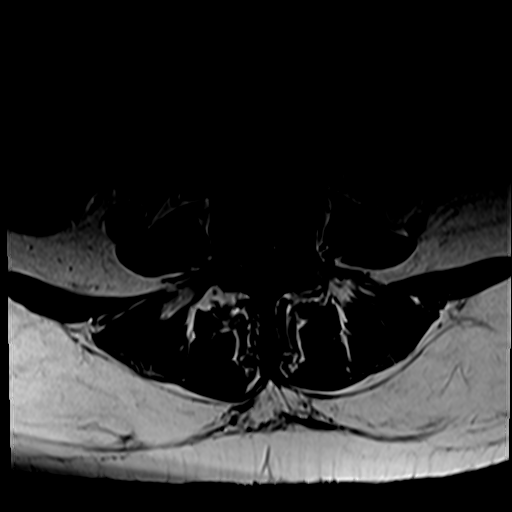
[im 18/38]
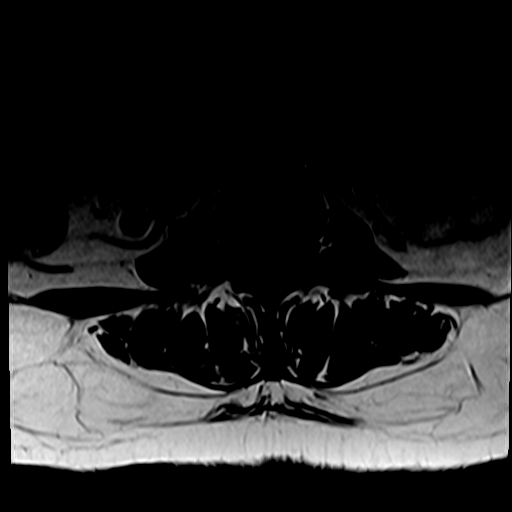
[im 20/38]
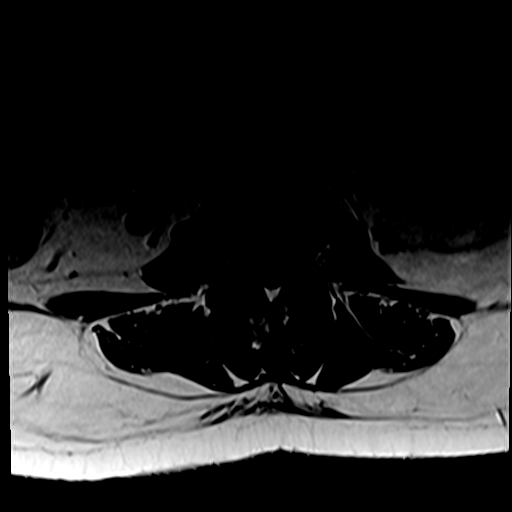
[im 26/38]
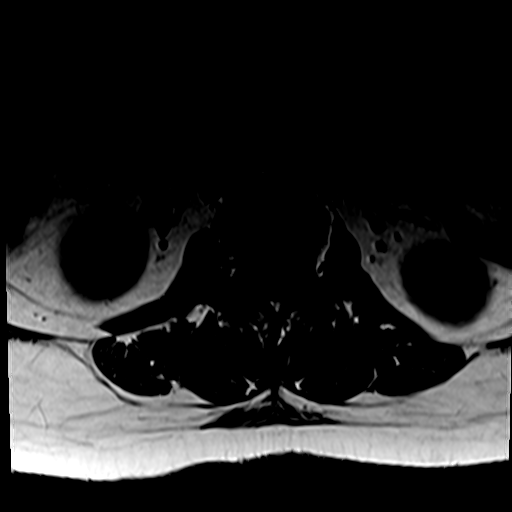
[im 32/38]
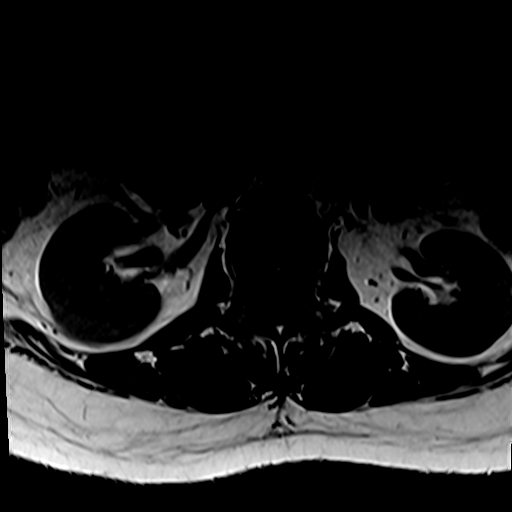
[im 38/38]
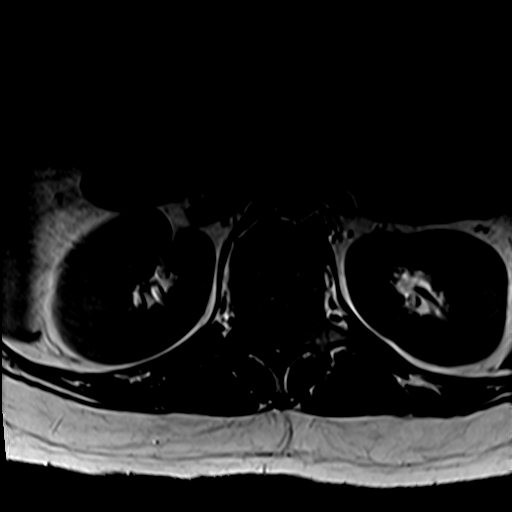

[31 of 48 positions shown; findings below may reference images not displayed]

FINDINGS: MRI CERVICAL SPINE FINDINGS

Alignment: Normal.

Vertebrae: Normal bone marrow signal intensity. No focal osseous
lesion.

Cord: Normal signal and morphology.

Posterior Fossa, vertebral arteries: Negative.

Disc levels: Disc spaces are preserved

C2-3: Negative

C3-4: Negative

C4-5: Negative

C5-6: Negative

C6-7: Negative

C7-T1: Negative

Paraspinal tissues: Negative.

MRI THORACIC SPINE FINDINGS

Alignment:  Normal.

Vertebrae: Normal bone marrow signal intensity. Small T6 hemangioma.

Cord:  Normal signal and morphology.

Paraspinal and other soft tissues: Negative.

Disc levels:

No significant spinal canal or neural foraminal narrowing. Mild
desiccation at the T8-9 level.

MRI LUMBAR SPINE FINDINGS

Segmentation:  S1 lumbarization.

Alignment:  Normal.

Vertebrae: Normal bone marrow signal intensity. No focal osseous
lesions.

Conus medullaris and cauda equina: Conus extends to the L2 level.
Conus and cauda equina appear normal.

Disc levels: Disc spaces are preserved.

L1-2: Negative

L2-3: Negative

L3-4: Negative

L4-5: Negative

L5-S1: Negative

Paraspinal and other soft tissues: Postpartum appearance of the
uterus.
IMPRESSION: No acute finding within the spine. Normal appearance of the spinal
cord and cauda equina.

No high-grade narrowing or impingement.

## 2020-12-07 IMAGING — MR MR CERVICAL SPINE W/O CM
5 series · 39 of 48 positions shown · non-contrast
Comparison: None.

CLINICAL DATA: Myelopathy, acute or progressive. Bilateral leg
weakness/numbness.

EXAM:
MRI TOTAL SPINE WITHOUT
TECHNIQUE: Multisequence MR imaging of the spine from the cervical spine to the
sacrum was performed without IV contrast administration.

[Series 1: T2 · sagittal · 3.0mm · 0.62mm/px · 6 of 15 slices shown (1 of 2)]
[im 1/15]
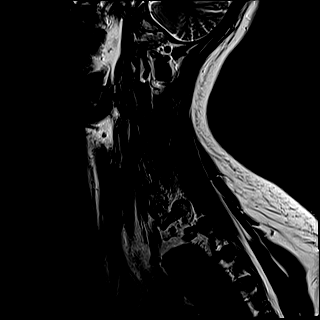
[im 3/15]
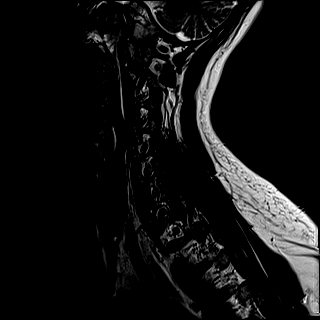
[im 6/15]
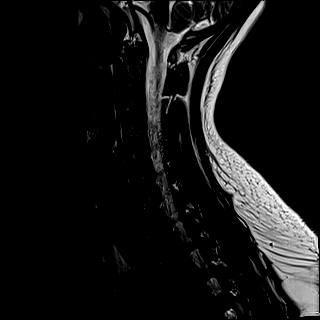
[im 9/15]
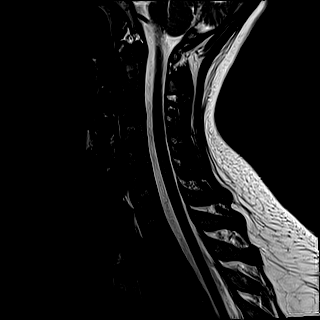
[im 12/15]
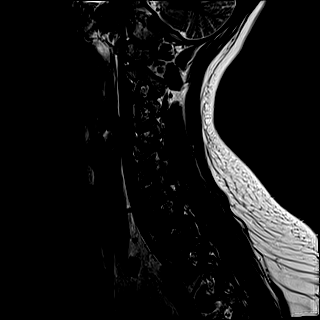
[im 15/15]
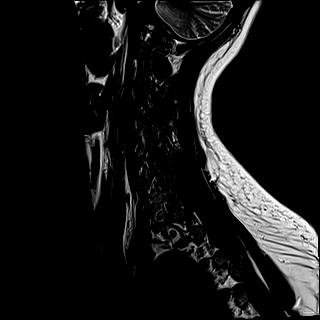

[Series 2: FLAIR · sagittal · 3.0mm · 0.78mm/px · 7 of 15 slices shown]
[im 1/15]
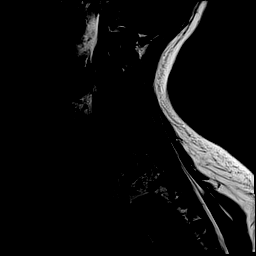
[im 3/15]
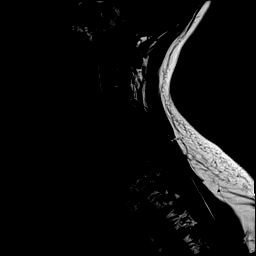
[im 5/15]
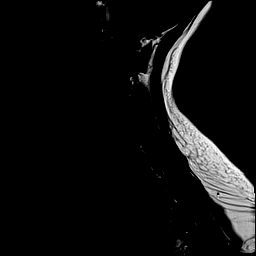
[im 8/15]
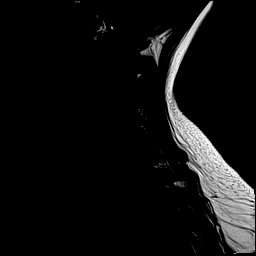
[im 10/15]
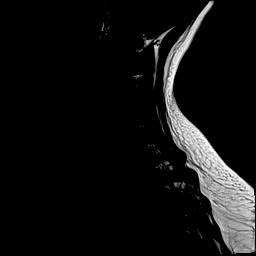
[im 12/15]
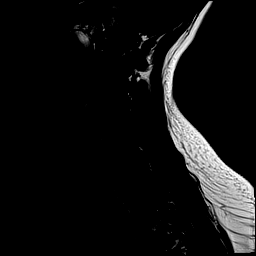
[im 15/15]
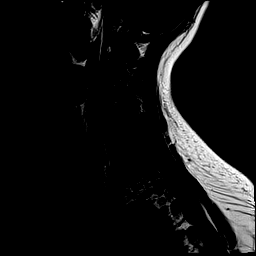

[Series 3: STIR · sagittal · 3.0mm · 0.62mm/px · 7 of 15 slices shown]
[im 1/15]
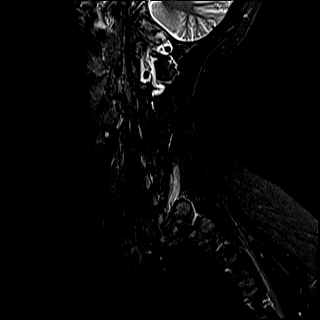
[im 3/15]
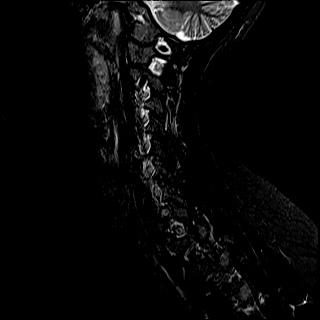
[im 5/15]
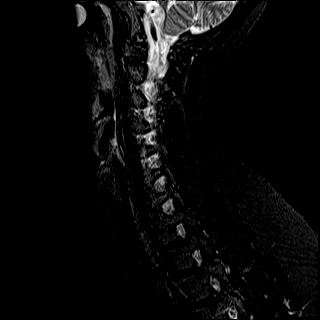
[im 8/15]
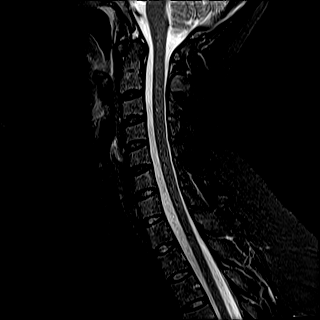
[im 10/15]
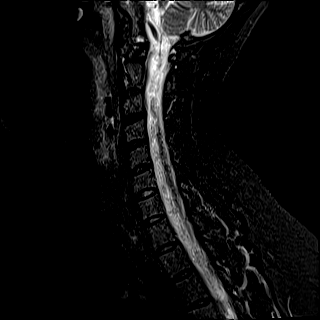
[im 12/15]
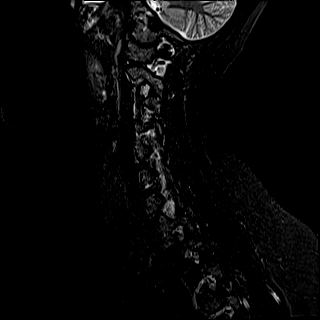
[im 15/15]
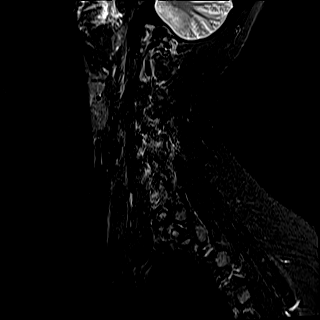

[Series 4: T2 · axial · 3.0mm · 0.70mm/px · z∈[-120,-23]mm · 11 of 29 slices shown (2 of 2)]
[im 1/29]
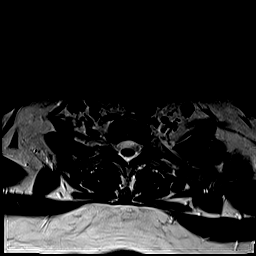
[im 3/29]
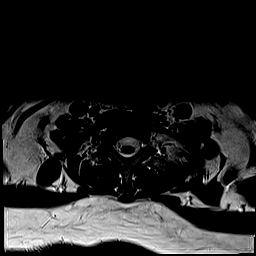
[im 5/29]
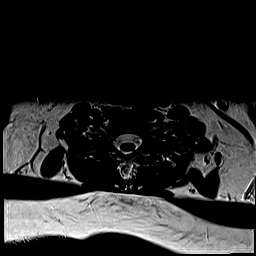
[im 7/29]
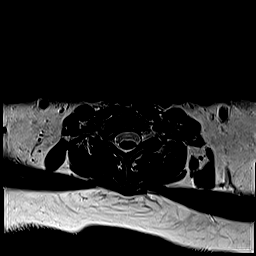
[im 9/29]
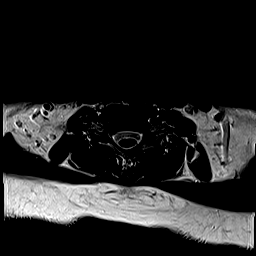
[im 11/29]
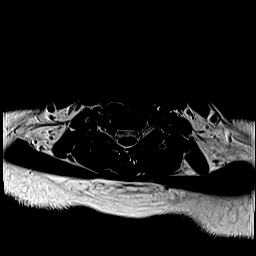
[im 13/29]
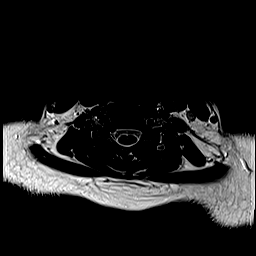
[im 16/29]
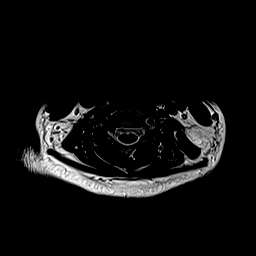
[im 20/29]
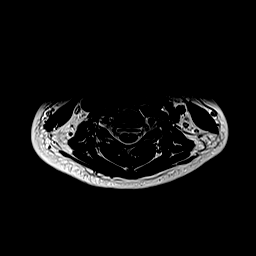
[im 24/29]
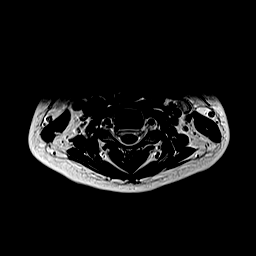
[im 29/29]
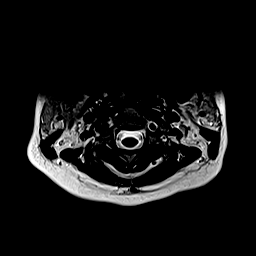

[Series 5: csp ax mpgr · axial · 3.0mm · 0.35mm/px · z∈[-120,-23]mm · 8 of 29 slices shown]
[im 1/29]
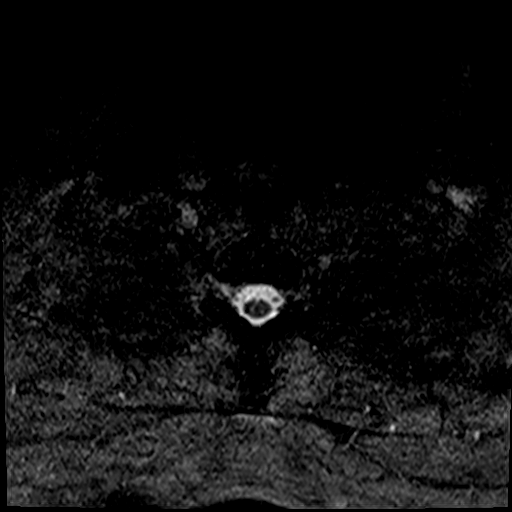
[im 5/29]
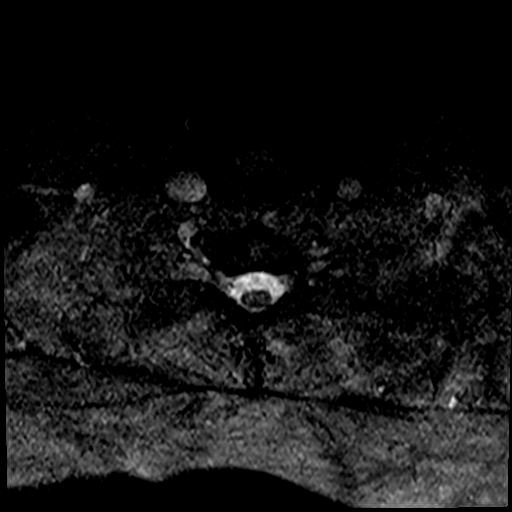
[im 9/29]
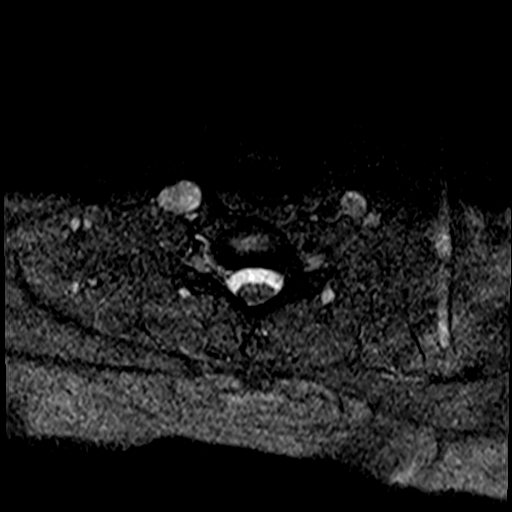
[im 13/29]
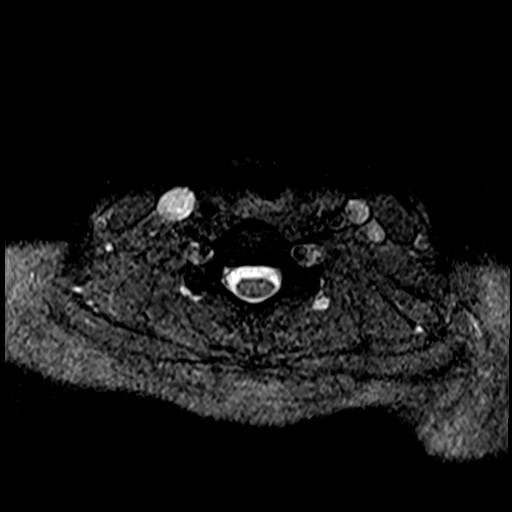
[im 16/29]
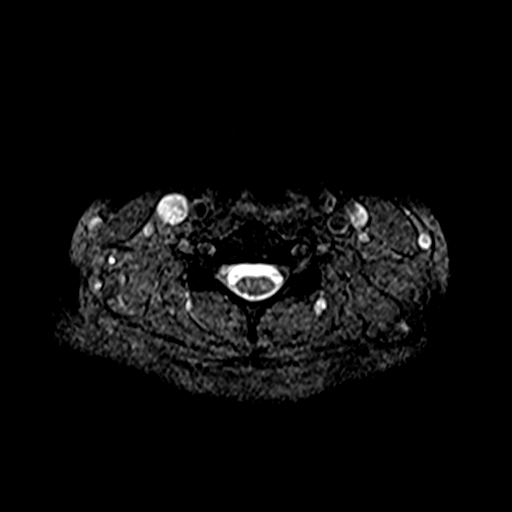
[im 20/29]
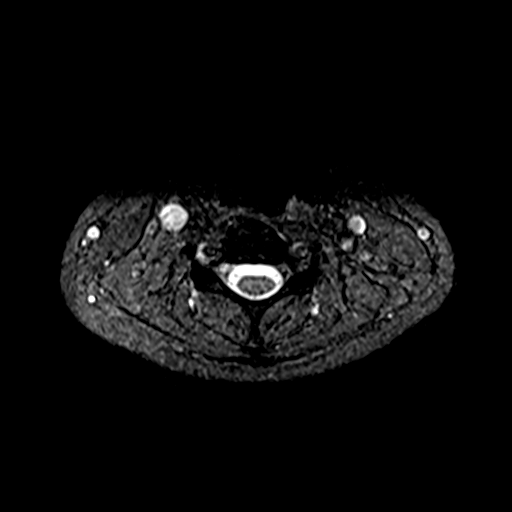
[im 24/29]
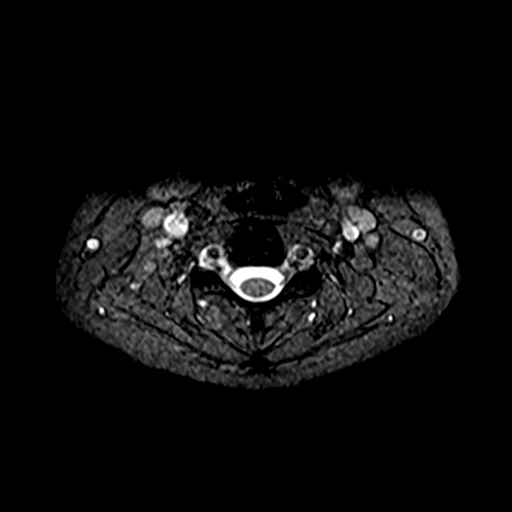
[im 29/29]
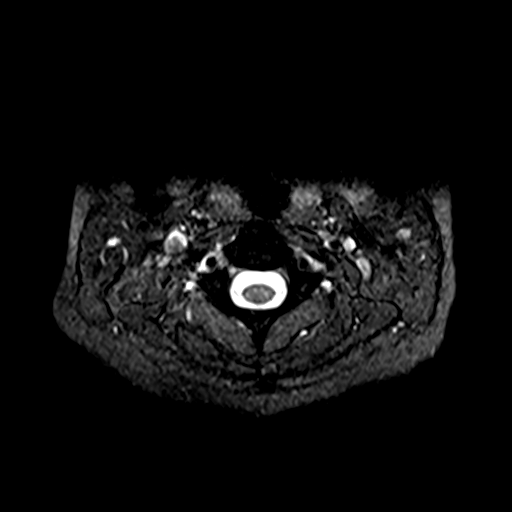

[39 of 48 positions shown; findings below may reference images not displayed]

FINDINGS: MRI CERVICAL SPINE FINDINGS

Alignment: Normal.

Vertebrae: Normal bone marrow signal intensity. No focal osseous
lesion.

Cord: Normal signal and morphology.

Posterior Fossa, vertebral arteries: Negative.

Disc levels: Disc spaces are preserved

C2-3: Negative

C3-4: Negative

C4-5: Negative

C5-6: Negative

C6-7: Negative

C7-T1: Negative

Paraspinal tissues: Negative.

MRI THORACIC SPINE FINDINGS

Alignment:  Normal.

Vertebrae: Normal bone marrow signal intensity. Small T6 hemangioma.

Cord:  Normal signal and morphology.

Paraspinal and other soft tissues: Negative.

Disc levels:

No significant spinal canal or neural foraminal narrowing. Mild
desiccation at the T8-9 level.

MRI LUMBAR SPINE FINDINGS

Segmentation:  S1 lumbarization.

Alignment:  Normal.

Vertebrae: Normal bone marrow signal intensity. No focal osseous
lesions.

Conus medullaris and cauda equina: Conus extends to the L2 level.
Conus and cauda equina appear normal.

Disc levels: Disc spaces are preserved.

L1-2: Negative

L2-3: Negative

L3-4: Negative

L4-5: Negative

L5-S1: Negative

Paraspinal and other soft tissues: Postpartum appearance of the
uterus.
IMPRESSION: No acute finding within the spine. Normal appearance of the spinal
cord and cauda equina.

No high-grade narrowing or impingement.

## 2020-12-07 IMAGING — MR MR THORACIC SPINE W/O CM
6 series · 29 of 48 positions shown · non-contrast
Comparison: None.

CLINICAL DATA: Myelopathy, acute or progressive. Bilateral leg
weakness/numbness.

EXAM:
MRI TOTAL SPINE WITHOUT
TECHNIQUE: Multisequence MR imaging of the spine from the cervical spine to the
sacrum was performed without IV contrast administration.

[Series 16: T1 · sagittal · 5.0mm · 1.88mm/px · 2 of 9 slices shown (1 of 2)]
[im 1/9]
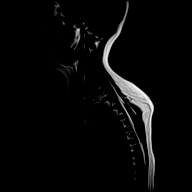
[im 9/9]
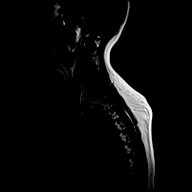

[Series 17: T2 · sagittal · 3.0mm · 1.06mm/px · 6 of 17 slices shown (1 of 2)]
[im 1/17]
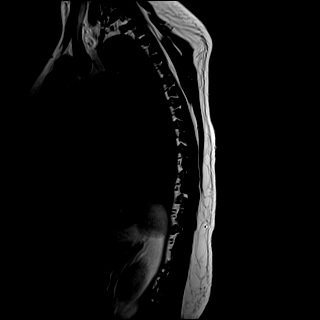
[im 4/17]
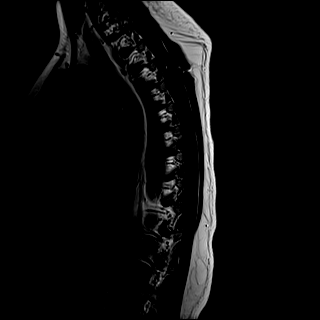
[im 7/17]
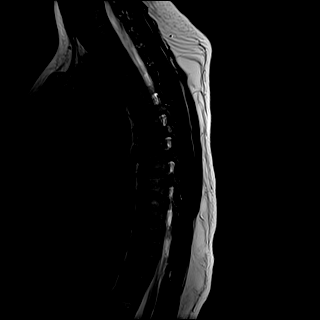
[im 10/17]
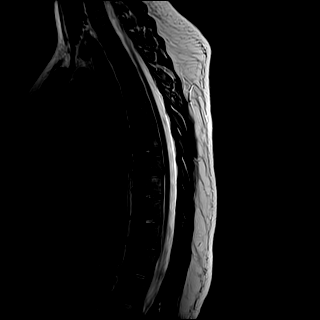
[im 13/17]
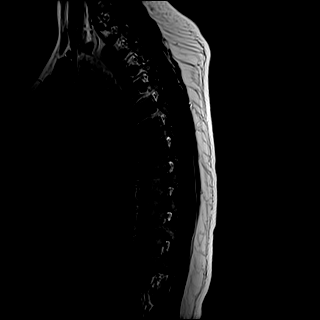
[im 17/17]
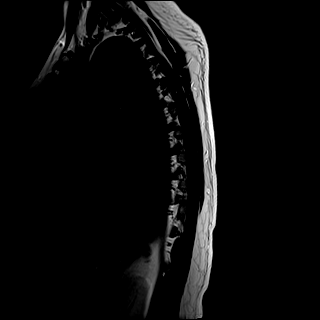

[Series 18: T1 · sagittal · 3.0mm · 1.06mm/px · 6 of 17 slices shown (2 of 2)]
[im 1/17]
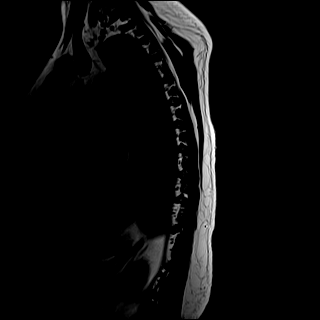
[im 4/17]
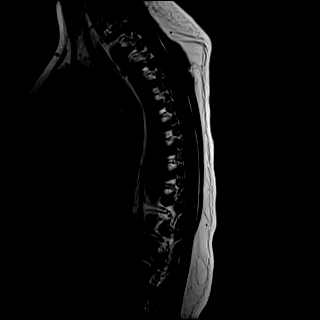
[im 7/17]
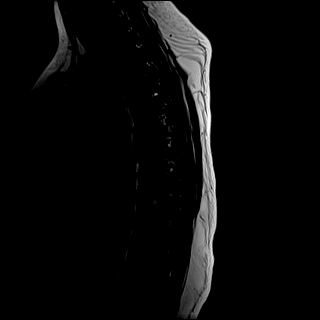
[im 10/17]
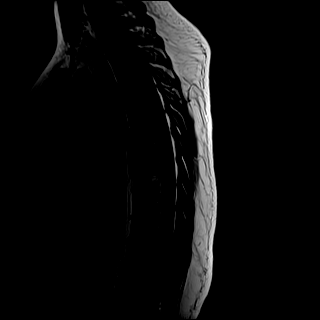
[im 13/17]
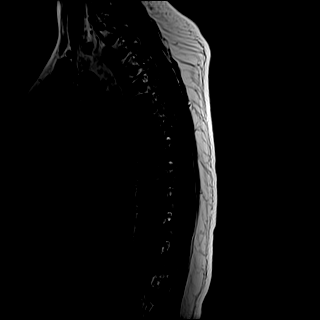
[im 17/17]
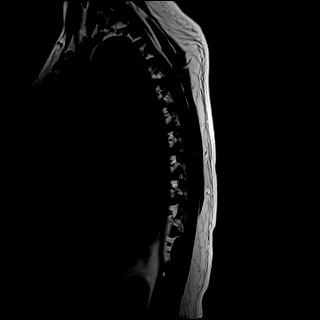

[Series 19: STIR · sagittal · 3.0mm · 0.53mm/px · 6 of 17 slices shown]
[im 1/17]
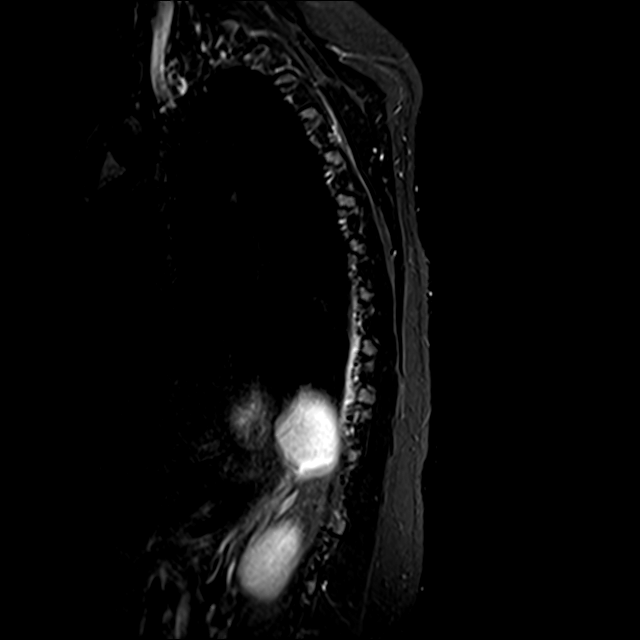
[im 4/17]
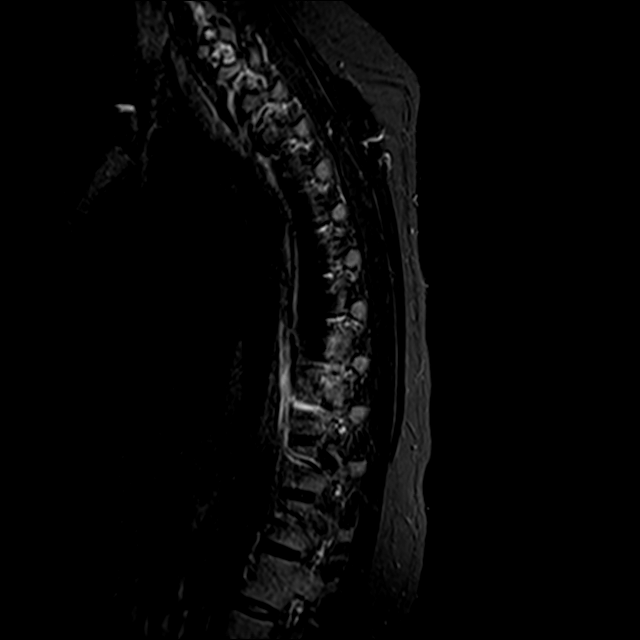
[im 7/17]
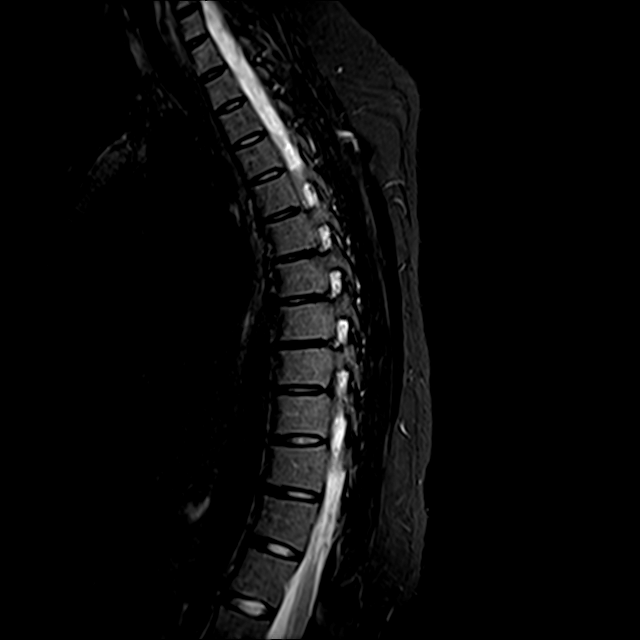
[im 10/17]
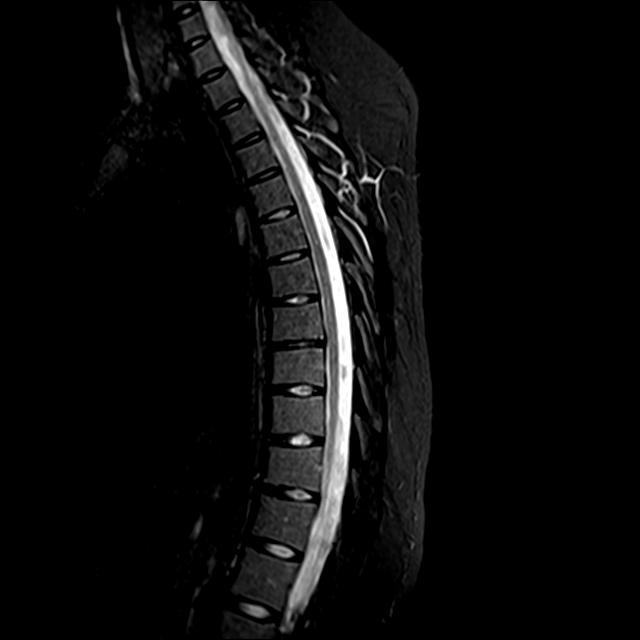
[im 13/17]
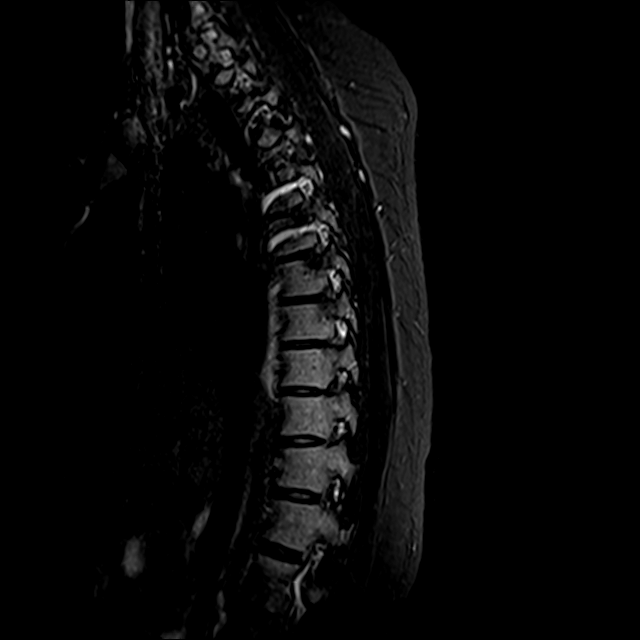
[im 17/17]
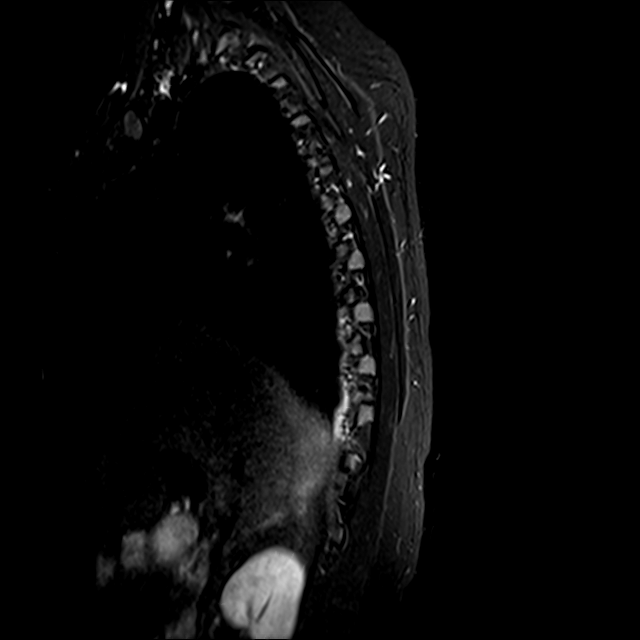

[Series 20: T2 · axial · 4.0mm · 0.59mm/px · z∈[-352,-129]mm · 8 of 39 slices shown (2 of 2)]
[im 1/39]
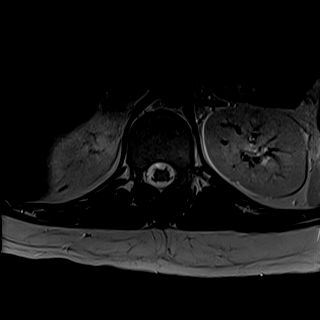
[im 6/39]
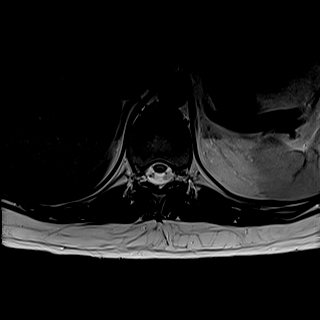
[im 12/39]
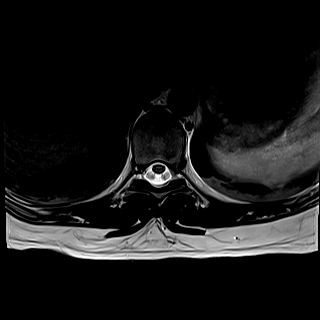
[im 18/39]
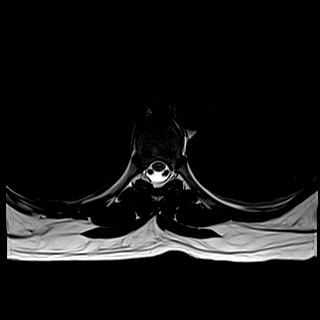
[im 21/39]
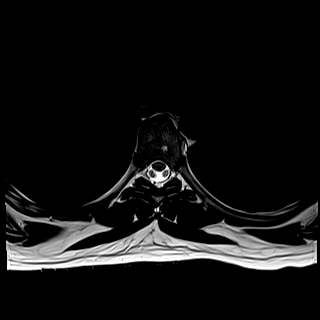
[im 27/39]
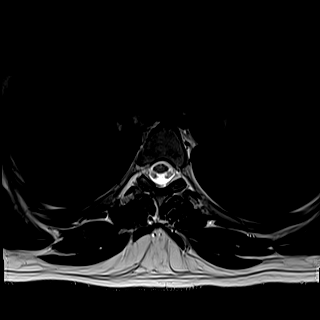
[im 33/39]
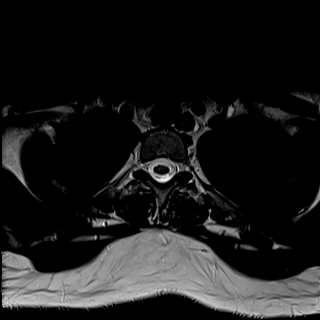
[im 39/39]
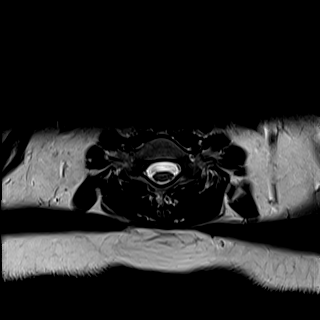

[Series 21: GRE · axial · 4.0mm · 0.37mm/px · 1 of 39 slices shown]
[im 1/39]
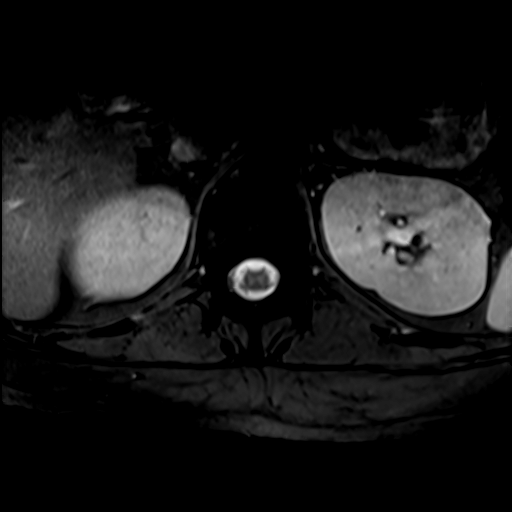

[29 of 48 positions shown; findings below may reference images not displayed]

FINDINGS: MRI CERVICAL SPINE FINDINGS

Alignment: Normal.

Vertebrae: Normal bone marrow signal intensity. No focal osseous
lesion.

Cord: Normal signal and morphology.

Posterior Fossa, vertebral arteries: Negative.

Disc levels: Disc spaces are preserved

C2-3: Negative

C3-4: Negative

C4-5: Negative

C5-6: Negative

C6-7: Negative

C7-T1: Negative

Paraspinal tissues: Negative.

MRI THORACIC SPINE FINDINGS

Alignment:  Normal.

Vertebrae: Normal bone marrow signal intensity. Small T6 hemangioma.

Cord:  Normal signal and morphology.

Paraspinal and other soft tissues: Negative.

Disc levels:

No significant spinal canal or neural foraminal narrowing. Mild
desiccation at the T8-9 level.

MRI LUMBAR SPINE FINDINGS

Segmentation:  S1 lumbarization.

Alignment:  Normal.

Vertebrae: Normal bone marrow signal intensity. No focal osseous
lesions.

Conus medullaris and cauda equina: Conus extends to the L2 level.
Conus and cauda equina appear normal.

Disc levels: Disc spaces are preserved.

L1-2: Negative

L2-3: Negative

L3-4: Negative

L4-5: Negative

L5-S1: Negative

Paraspinal and other soft tissues: Postpartum appearance of the
uterus.
IMPRESSION: No acute finding within the spine. Normal appearance of the spinal
cord and cauda equina.

No high-grade narrowing or impingement.

## 2020-12-07 MED ORDER — CYANOCOBALAMIN 1000 MCG/ML IJ SOLN
1000.0000 ug | Freq: Every day | INTRAMUSCULAR | Status: AC
  Administered 2020-12-07 – 2020-12-13 (×7): 1000 ug via INTRAMUSCULAR
  Filled 2020-12-07 (×7): qty 1

## 2020-12-07 MED ORDER — FERROUS SULFATE 325 (65 FE) MG PO TABS
325.0000 mg | ORAL_TABLET | Freq: Two times a day (BID) | ORAL | Status: DC
  Administered 2020-12-07 – 2020-12-14 (×13): 325 mg via ORAL
  Filled 2020-12-07 (×14): qty 1

## 2020-12-07 MED ORDER — FERROUS SULFATE 325 (65 FE) MG PO TABS: 325.0000 mg | ORAL_TABLET | Freq: Two times a day (BID) | ORAL | Status: DC

## 2020-12-07 NOTE — Lactation Note (Signed)
This note was copied from a baby's chart. Lactation Consultation Note  Patient Name: Robyn Dunn Today's Date: 12/07/2020   Age:25 hours  Maternal Data   Every time I checked on mom and baby today, she was giving a bottle of formula.  Mom reported that she had been putting her to the breast.  Mom now says that she has only took 5 or 6 sucks at the breast and that she has been giving mostly formula because she does not have any milk.  Demonstrated again by hand expression that mom does have colostrum.  Mom's breasts feel slightly firm.  Mom's nipples are slightly flat, but evert with compression.  Explained supply and demand stressing importance of frequent stimulation to the breast to bring in mature milk, ensure a plentiful milk supply and prevent engorgement.  Alvira Philips is giving feeding cues which were pointed out to mom.  Mom willing to put her back to the breast even though she has just given a bottle of formula within the hour.  Several attempts were made to get her to latch without success.  She would not open her mouth wide enough to achieve latch.  Once #24 nipple shield was applied with some Similac in the tip of the nipple shield, she latched and began sucking.  As soon as the formula was gone from the nipple shield, she quit sucking again.  Continued to give a few drops with curved tip syringe along side of the nipple shield.  She continued to suck for about 10 minutes and took in 7 ml of Similac.  Mom wanted to pump after breast feed for extra stimulation.  Set up Symphony pump explaining manual and electric pumping.  Parents instructed in breast massage, hand expression, pumping, collection, storage, cleaning, labeling and handling of expressed milk.  Mom pumped using the electric pump and got some colostrum which was rubbed on her nipples to prevent bacteria, lubricate and help with tenderness.  Coconut oil given as well and instructed in use.  Encouraged mom to call with any  further questions, concerns or assistance with breast feeding or pumping. Feeding Feeding Type: Breast Fed Nipple Type: Slow - flow  LATCH Score                   Interventions    Lactation Tools Discussed/Used     Consult Status      Louis Meckel 12/07/2020, 9:47 PM

## 2020-12-07 NOTE — Progress Notes (Signed)
2000: Upon assessment while touching the anterior portion of feet, pt stated "your hands feel cold". While adding pressure on the posterior side of pts feet, pt stated "I feel cramping". Pt also stated that when she sits upright for a long period of time the area around her coccyx feels numb. Pt was encouraged to change positions while laying in bed.   2215: Pt stated she does not have a car seat for baby. This RN made patient aware that they will need a car seat for a car seat test prior to baby being discharged. Pt stated they will be able to have someone bring in a car seat tomorrow.

## 2020-12-07 NOTE — Consult Note (Signed)
Neurology Consultation Reason for Consult: Bilateral ankle numbness / weakness after spinal anesthesia for C-section  Requesting Physician: Jennell Corner  CC: Bilateral foot weakness and numbness  History is obtained from: Patient and chart review   HPI: Robyn Dunn is a 25 y.o. female with a past medical history significant for recent repeat C-section, otherwise healthy.  She was in her usual state of health and then presented for rupture of membranes at 35 weeks and 1 day.  For anesthesia she received a spinal at the L3-L4 level at about 11:30 AM which was uncomplicated with clear CSF return.  C-section was also straightforward per review of operative report.  She did receive nitrous oxide for anesthesia.  At approximately 10:30 PM that night when she got up to try to use the bedside commode for a bowel movement she had difficulty ambulating and reported that her feet were numb.  She reports that this lack of sensation and weakness has persisted, without worsening or improving since she first noticed it.  She has been having able to have a bowel movement today, which was normal in quality and which she reports there was no loss of sensation or numbness in the genital or rectal areas.  Foley catheter has been continued, so it is unclear if she has any urinary symptoms at this time but she denies any pain.  Prior to this she denies any focal neurological issues and reports she was in good health.  ROS: A 14 point ROS was performed and is negative except as noted in the HPI.  History reviewed. No pertinent past medical history. 1. Previous c/section, unspecified - midline vertical scar on abdomen ([place] for breech)   Family History  Problem Relation Age of Onset  . Diabetes Mother   . Diabetes Maternal Grandmother    Social History:  reports that she has never smoked. She has never used smokeless tobacco. She reports previous alcohol use. She reports previous drug  use.  Exam: Current vital signs: BP 112/67 (BP Location: Left Arm)   Pulse 63   Temp 98.5 F (36.9 C)   Resp 18   Ht 5\' 9"  (1.753 m)   Wt 123.4 kg   LMP 04/04/2020   SpO2 99%   Breastfeeding Unknown   BMI 40.17 kg/m  Vital signs in last 24 hours: Temp:  [98 F (36.7 C)-98.5 F (36.9 C)] 98.5 F (36.9 C) (01/03 1117) Pulse Rate:  [57-88] 63 (01/03 1117) Resp:  [10-37] 18 (01/03 1117) BP: (100-127)/(64-84) 112/67 (01/03 1117) SpO2:  [96 %-100 %] 99 % (01/03 1117)   Physical Exam  Constitutional: Appears well-developed and well-nourished.  Psych: Affect appropriate to situation, pleasant and cooperative Eyes: No scleral injection HENT: No OP obstruction, good dentition MSK: no joint deformities.  Cardiovascular: Normal rate and regular rhythm.  Respiratory: Effort normal, non-labored breathing GI: Soft.  No distension.  Skin: WDI  Neuro: Mental Status: Patient is awake, alert, oriented to person, place, month, year, and situation. Patient is able to give a clear and coherent history. No signs of aphasia or neglect Cranial Nerves: II: Visual Fields are full. Pupils are equal, round, and reactive to light.   III,IV, VI: EOMI without ptosis or diploplia.  V: Facial sensation is symmetric to temperature VII: Facial movement is symmetric.  VIII: hearing is intact to voice X: Uvula elevates symmetrically XI: Shoulder shrug is symmetric. XII: tongue is midline without atrophy or fasciculations.  Motor: Tone is normal. Bulk is normal. 5/5 strength  was present in bilateral UE.  Lower extremities R and L out of 5: hip flexion was 4- and 4 Knee extension 5 and 5 Knee flexion 4- and 4- Ankle plantar and dorsiflexion 0 and 0  Sensory: Sensation is symmetric to light touch and temp in the arms and legs, with slight length dependent loss in the legs bilaterally, superimposed with severe loss of sensation to all modalities in the feet. Reports temperature is 30% at the dorsal  feet compared to the shins. Loss of proprioception and vibration as well.  Deep Tendon Reflexes: 2+ and symmetric in the biceps and patellae. Ankle jerks 1+, no clonus,  Plantars: Toes are mute bilaterally.  Cerebellar: FNF and HKS are intact bilaterally Gait not tested due to weakness   I have reviewed labs in epic and the results pertinent to this consultation are: CBC normal with expected ~1 point loss in setting of recent operation  I have reviewed the images obtained: MRI C, T and L-spine pending  Impression: This is a 25 year old previously healthy woman postpartum day 1 after repeat C-section, with nitrous oxide used during anesthesia and spinal level at L3/L4.  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  Differential includes a subclinical B12 deficiency unmasked by nitrous oxide exposure versus more urgently potential complication of spinal anesthesia such as epidural hematoma, though given the location of the spinal, would expect lower motor neuron signs in this case instead of an upper motor neuron pattern.  Given her age, transverse myelitis such as with new onset MS is also a possibility though less likely in the setting of very recent pregnancy.  Recommendations: - MRI C-spine, T-spine and L-spine w/o contrast  - CMP - B12 and MMA  - Empric B12 1000 mcg IM daily x 7 days, followed by 1000 mcg PO daily, pending levels (may discontinue if levels result normal)   Brooke Dare MD-PhD [facility] [phone]

## 2020-12-07 NOTE — Progress Notes (Signed)
Pt leaving floor with transport for MRI.  FOB at bedside with infant.

## 2020-12-07 NOTE — Progress Notes (Signed)
Post Partum Day 1  Subjective: Having issues with numbness and weakness in both feet. Anesthesia and Neurology have been in to evaluate.  As a result of the numbness and weakness she is unable to get out of bed and her foley has remained in. She has had a BM on bedpan.   Otherwise, doing well. pain managed with PO meds, and tolerating regular diet.   No fever/chills, chest pain, shortness of breath, nausea/vomiting, or leg pain. No nipple or breast pain.   Objective: BP 112/67 (BP Location: Left Arm)   Pulse 63   Temp 98.5 F (36.9 C)   Resp 18   Ht 5\' 9"  (1.753 m)   Wt 123.4 kg   LMP 04/04/2020   SpO2 99%   Breastfeeding Unknown   BMI 40.17 kg/m    Physical Exam:  General: alert and cooperative Breasts: soft/nontender CV: RRR Pulm: nl effort Abdomen: soft, non-tender Uterine Fundus: firm Incision: dressing c/d/i Perineum: intact Lochia: appropriate DVT Evaluation: SCDs on Edinburgh:  Edinburgh Postnatal Depression Scale Screening Tool 12/06/2020  I have been able to laugh and see the funny side of things. 0  I have looked forward with enjoyment to things. 0  I have blamed myself unnecessarily when things went wrong. 2  I have been anxious or worried for no good reason. 2  I have felt scared or panicky for no good reason. 1  Things have been getting on top of me. 2  I have been so unhappy that I have had difficulty sleeping. 0  I have felt sad or miserable. 1  I have been so unhappy that I have been crying. 1  The thought of harming myself has occurred to me. 0  Edinburgh Postnatal Depression Scale Total 9     Recent Labs    12/06/20 0544 12/07/20 0637  HGB 9.7* 8.3*  HCT 29.2* 26.1*  WBC 9.3 9.5  PLT 287 207    Seen by Dr. 02/04/21 Impression: This is a 25 year old previously healthy woman postpartum day 1 after repeat C-section, with nitrous oxide used during anesthesia and spinal level at L3/L4.  Her exam is consistent with a mild length dependent  neuropathy in her lower extremities overlaid with an upper motor neuron pattern of weakness in the bilateral lower extremities as well as loss of sensation localizing to dorsal columns as well as spinothalamic tracts.  Differential includes a subclinical B12 deficiency unmasked by nitrous oxide exposure versus more urgently potential complication of spinal anesthesia such as epidural hematoma, though given the location of the spinal, would expect lower motor neuron signs in this case instead of an upper motor neuron pattern.  Given her age, transverse myelitis such as with new onset MS is also a possibility though less likely in the setting of very recent pregnancy.  Recommendations: - CMP; - B12 and MMA; - MRI C-spine, T-spine and L-spine  Assessment/Plan: 25 y.o. 25 postpartum day # 1  -Continue routine postpartum care -Being evaluated by Neuro for weakness and numbness in feet -Lactation consult PRN for breastfeeding  -RN placed social work consult for EPDS of 9 -Acute blood loss anemia - hemodynamically stable and asymptomatic; start PO ferrous sulfate BID with stool softeners    Disposition: Continue inpatient postpartum care    LOS: 1 day   Breanah Faddis, CNM 12/07/2020, 5:29 PM

## 2020-12-07 NOTE — Progress Notes (Signed)
Pt back to [facility] from MRI.

## 2020-12-07 NOTE — Progress Notes (Signed)
Spoke with anesthesia following up on pt still unable to feel feet bilaterally since spinal from c/s yesterday. MD on call today checking with Dr. Pernell Dupre who did spinal to check for any possible issues and to come round on pt.

## 2020-12-07 NOTE — Progress Notes (Signed)
MRI notified of STAT order, report given.  Pt updated, no c/o claustrophobia.

## 2020-12-07 NOTE — Anesthesia Post-op Follow-up Note (Signed)
  Anesthesia Pain Follow-up Note  Patient: Jenavi Beedle  Day #: 1  Date of Follow-up: 12/07/2020 Time: 1:03 PM  Last Vitals:  Vitals:   12/07/20 0900 12/07/20 1117  BP:  112/67  Pulse: (!) 57 63  Resp:  18  Temp:  36.9 C  SpO2: 100% 99%    Level of Consciousness: alert  Pain: mild   Side Effects:None  Catheter Site Exam:clean, dry  Anti-Coag Meds (From admission, onward)   Start     Dose/Rate Route Frequency Ordered Stop   12/07/20 1200  enoxaparin (LOVENOX) injection 62.5 mg        62.5 mg Subcutaneous Every 24 hours 12/06/20 1336         Plan: Given patient still having weakness and numbness below the ankle bilaterally we will have neurology see the patient and make recommendations at this time.  We will follow up with the patient again tomorrow.  Lenard Simmer

## 2020-12-07 NOTE — Anesthesia Postprocedure Evaluation (Signed)
Anesthesia Post Note  Patient: Robyn Dunn  Procedure(s) Performed: CESAREAN SECTION  Patient location during evaluation: Mother Baby Anesthesia Type: Spinal Level of consciousness: oriented and awake and alert Pain management: pain level controlled Vital Signs Assessment: post-procedure vital signs reviewed and stable Respiratory status: spontaneous breathing and respiratory function stable Cardiovascular status: blood pressure returned to baseline and stable Postop Assessment: no headache, no backache, no apparent nausea or vomiting, adequate PO intake and patient able to bend at knees Anesthetic complications: no Comments: Patient able to bend at knees, but is endorsing numbness and weakness at bilateral ankles.  Patient cannot dorsiflex or plantarflex at the ankle and endorses electrical sensation with pin prick at the feet.  Able to extend and flex at the knee and has feeling on the anterior and posterior portion of her lower leg.  No complications noted with spinal placement.  No loss of bowel function, but foley still in place as patient has not been able to bear weight or walk at this time.  Will consult neurology at this time.   No complications documented.   Last Vitals:  Vitals:   12/07/20 0900 12/07/20 1117  BP:  112/67  Pulse: (!) 57 63  Resp:  18  Temp:  36.9 C  SpO2: 100% 99%    Last Pain:  Vitals:   12/07/20 1000  TempSrc:   PainSc: 0-No pain                 Lenard Simmer

## 2020-12-08 LAB — RUBELLA SCREEN: Rubella: 4.18 index (ref >0.99)

## 2020-12-08 LAB — VARICELLA ZOSTER ANTIBODY, IGG: Varicella IgG: <135 index — ABNORMAL LOW (ref >165)

## 2020-12-08 NOTE — Progress Notes (Signed)
Pt seen and evaluated this morning with interpreter assistance.  She continues to show signs of diminished strength on flexion and extension of both feet upon exam.  Good thigh strength and able to extend bilaterally at the knees without deficit and 4/5 strength to flexion at the bilateral knees.  Appears to have intact sphincter tone according to patient.\\  MRI noted and notes reviewed.  Situation discussed with patient via interpreter.  Will await further Neurology input.    Dr. Pernell Dupre Anesthesia

## 2020-12-08 NOTE — Progress Notes (Signed)
Neurology Progress Note  S: No new complaints however continues to have weakness in her feet which has not progressed.   O: Current vital signs: BP 95/60 (BP Location: Left Arm)   Pulse 61   Temp 97.8 F (36.6 C) (Oral)   Resp 18   Ht $R'5\' 9"'qG$  (1.753 m)   Wt 123.4 kg   LMP 04/04/2020   SpO2 99%   Breastfeeding Unknown   BMI 40.17 kg/m  Vital signs in last 24 hours: Temp:  [97.8 F (36.6 C)-98 F (36.7 C)] 97.8 F (36.6 C) (01/04 0759) Pulse Rate:  [61-77] 61 (01/04 0759) Resp:  [18-20] 18 (01/04 0759) BP: (95-120)/(60-68) 95/60 (01/04 0759) SpO2:  [98 %-99 %] 99 % (01/04 0759) GENERAL: Awake, alert in NAD HEENT: Normocephalic and atraumatic, moist mm, no LN, no Thyromegally LUNGS: Clear to auscultation bilaterally with no wheezes CV: S1S2 RRR, no m/r/g, equal pulses bilaterally. ABDOMEN: Soft, nontender, nondistended with normoactive BS. Ext: warm, well perfused, intact peripheral pulses  NEURO:  Mental Status: AA&Ox3  Language: speech is normal. Naming, repetition, fluency, and comprehension intact. Cranial Nerves: PERRL 41mm/brisk. EOMI, visual fields full, no facial asymmetry, facial sensation intact, hearing intact, tongue/uvula/soft palate midline, normal sternocleidomastoid and trapezius muscle strength. No evidence of tongue atrophy or fibrillations. Motor: 5/5 upper extremities, 5/5 at the hips and thighs. 0/5 Plantar/dorsflexion, 0/5 inversion/eversion.5 Tone: is normal and bulk is normal. Sensation- decreased light touch bilaterally ankles--->toes. Temperature sensation intact except decreased bilateral L5-S1. Reflexes 2+ throughout, ~1+ ankles No Babinski L5 and above intact with vigorous withdrawal. Mild pes cavus bilaterally. Coordination: FTN intact bilaterally, heel to shin intact bilaterally. Great toe proprioception impaired bilaterally.  Gait- attempted however unsteady.   Medications  Current Facility-Administered Medications:  .  acetaminophen  (TYLENOL) tablet 1,000 mg, 1,000 mg, Oral, Q6H, Schermerhorn, Gwen Her, MD, 1,000 mg at 12/08/20 1131 .  bupivacaine liposome (EXPAREL) 1.3 % injection 266 mg, 20 mL, Infiltration, Once, Minda Meo, CNM .  coconut oil, 1 application, Topical, PRN, Schermerhorn, Gwen Her, MD .  cyanocobalamin ((VITAMIN B-12)) injection 1,000 mcg, 1,000 mcg, Intramuscular, Daily, Bhagat, Srishti L, MD, 1,000 mcg at 12/07/20 2015 .  witch hazel-glycerin (TUCKS) pad 1 application, 1 application, Topical, PRN **AND** dibucaine (NUPERCAINAL) 1 % rectal ointment 1 application, 1 application, Rectal, PRN, Schermerhorn, Gwen Her, MD .  diphenhydrAMINE (BENADRYL) capsule 25 mg, 25 mg, Oral, Q6H PRN, Schermerhorn, Gwen Her, MD .  enoxaparin (LOVENOX) injection 62.5 mg, 62.5 mg, Subcutaneous, Q24H, Schermerhorn, Gwen Her, MD, 62.5 mg at 12/08/20 1131 .  ferrous sulfate tablet 325 mg, 325 mg, Oral, BID WC, Schermerhorn, Gwen Her, MD, 325 mg at 12/08/20 0831 .  gabapentin (NEURONTIN) capsule 300 mg, 300 mg, Oral, QHS, Schermerhorn, Gwen Her, MD, 300 mg at 12/07/20 2015 .  [EXPIRED] ketorolac (TORADOL) 30 MG/ML injection 30 mg, 30 mg, Intravenous, Q6H, 30 mg at 12/07/20 0245 **FOLLOWED BY** ibuprofen (ADVIL) tablet 800 mg, 800 mg, Oral, Q6H, Schermerhorn, Gwen Her, MD, 800 mg at 12/08/20 1131 .  menthol-cetylpyridinium (CEPACOL) lozenge 3 mg, 1 lozenge, Oral, Q2H PRN, Schermerhorn, Gwen Her, MD .  morphine 2 MG/ML injection 1-2 mg, 1-2 mg, Intravenous, Q3H PRN, Schermerhorn, Gwen Her, MD, 2 mg at 12/06/20 1650 .  oxyCODONE (Oxy IR/ROXICODONE) immediate release tablet 5-10 mg, 5-10 mg, Oral, Q4H PRN, Schermerhorn, Gwen Her, MD .  prenatal multivitamin tablet 1 tablet, 1 tablet, Oral, Q1200, Schermerhorn, Gwen Her, MD, 1 tablet at 12/08/20 1131 .  senna-docusate (Senokot-S) tablet 2 tablet,  2 tablet, Oral, Daily, Schermerhorn, Gwen Her, MD, 2 tablet at 12/07/20 [phone] .  simethicone (MYLICON) chewable tablet 80 mg, 80 mg, Oral, TID  PC, Schermerhorn, Gwen Her, MD, 80 mg at 12/07/20 1629 .  simethicone (MYLICON) chewable tablet 80 mg, 80 mg, Oral, PRN, Schermerhorn, Gwen Her, MD .  sodium chloride flush (NS) 0.9 % injection 50 mL, 50 mL, Other, Once, Minda Meo, CNM .  Tdap (BOOSTRIX) injection 0.5 mL, 0.5 mL, Intramuscular, Once, Schermerhorn, Gwen Her, MD .  zolpidem (AMBIEN) tablet 5 mg, 5 mg, Oral, QHS PRN, Schermerhorn, Gwen Her, MD Labs CBC    Component Value Date/Time   WBC 9.5 12/07/2020 0637   RBC 2.78 (L) 12/07/2020 0637   HGB 8.3 (L) 12/07/2020 0637   HCT 26.1 (L) 12/07/2020 0637   PLT 207 12/07/2020 0637   MCV 93.9 12/07/2020 0637   MCH 29.9 12/07/2020 0637   MCHC 31.8 12/07/2020 0637   RDW 12.8 12/07/2020 0637    CMP     Component Value Date/Time   NA 138 12/07/2020 1344   K 3.8 12/07/2020 1344   CL 105 12/07/2020 1344   CO2 24 12/07/2020 1344   GLUCOSE 93 12/07/2020 1344   BUN 10 12/07/2020 1344   CREATININE 0.48 12/07/2020 1344   CALCIUM 8.5 (L) 12/07/2020 1344   PROT 6.3 (L) 12/07/2020 1344   ALBUMIN 2.3 (L) 12/07/2020 1344   AST 20 12/07/2020 1344   ALT 15 12/07/2020 1344   ALKPHOS 141 (H) 12/07/2020 1344   BILITOT 0.4 12/07/2020 1344   GFRNONAA >60 12/07/2020 1344   HIV (-)  Hepatitis B (-)   Imaging I have reviewed images in epic and the results pertinent to this consultation are:MRI total spine did not show acute findings within the spine. Normal appearance of the spinal cord and cauda equina. No high-grade narrowing or impingement.    Assessment: 25 year old previously healthy woman postpartum day 25 after repeat C-section with anesthesia at spinal level L3/L4. Exam showed intact bilateral hip extension, abduction (superior gluteal nerve, L5 nerve root), adduction and flexion with impaired foot dorsiflexion and eversion (peroneal nerve) and impaired plantar flexion and inversion (tibial nerve). Sensory exam showed decreased temperature sensation in L5-S1 distribution, and  though heel to shin was intact bilaterally she had impaired proprioception at the great toe. Constellation of symptoms suggests bilateral distal sciatic nerve lesions (?) however differential diagnosis would also include B12 deficiency, copper deficiency, lyme, heavy metal, sarcoidosis, HCV, late onset atypical Charcot-Marie-Tooth disease type 2 (pes cavus/no family history) to name a few, as well as functional neurologic disorder.   Continue empric B12 1000 mcg IM daily x 7 days, followed by 1000 mcg PO daily, pending levels (may discontinue if levels result normal)  Ideally nerve conduction study would be most practical and efficient however if not possible, the patient would likely need further laboratory studies: MMA pending. Labs: Glycohemoglobin  Ionized calcium TSH  ANA  ESR  CRP  RF  Homocysteine level Serum copper level Thiamine level  Anti-tissue transglutaminase antibodies  HCV  Lyme  Erythrocyte transketolase activation assay/whole blood level  SPEP and UPEP with immunofixation  Urine/blood for heavy metals Urine/blood for porphyrins If these studies are negative she may need autoantibody panel for neuropathic syndromes less likely paraneoplastic panel.  The patient's have not progressed however if symptoms worsen may consider pulsed steroid or IVIg therapy.  Lynnae Sandhoff, MD Page: [phone]

## 2020-12-08 NOTE — Evaluation (Signed)
Physical Therapy Evaluation Patient Details Name: Robyn Dunn MRN: [id number] DOB: [birth year] Today's Date: 12/08/2020   History of Present Illness  Pt is a 25 y.o. female s/p repeat C-section 12/06/20 with spinal anesthesia (level L3/L4) d/t rupture of membranes at 35 weeks and 1 day.  Impaired sensation and strength B feet since.  No significant findings on CTL imaging.  Clinical Impression  Prior to hospital admission, pt was independent with ambulation; lives in [place]. Currently pt demonstrating B LE impairments: 0/5 B ankle DF/PF/inversion/eversion strength; 4/5 B knee flexion/extension strength; at least 3+/5 B hip flexion strength; absent light touch sensation plantar surface of foot B; minimal light touch sensation dorsal surface of foot and distal lateral lower leg B; 2/5 attempts for great toe proprioception (5/5 attempts proprioception at ankle joint/intact above ankle) B; mild decreased heel to shin coordination B; tone intact B LE's.  Currently pt is SBA with bed mobility and mod to max assist to stand up to RW from mildly elevated bed (see below for details).  Pt demonstrating B LE's pushing against bed (with knees hyperextending) in order to maintain standing and take 1 step each B LE's (pt's knees buckling if not stabilizing against bed) with RW use.  Pain 2-3/10 abdominal incision beginning/end of session.  Pt would benefit from skilled PT to address noted impairments and functional limitations (see below for any additional details).  Upon hospital discharge, pt would benefit from CIR.  Spanish interpreter Costella Hatcher (ID [phone]) utilized via portable interpreter for entire session.    Follow Up Recommendations CIR    Equipment Recommendations  Rolling walker with 5" wheels;3in1 (PT);Wheelchair (measurements PT);Wheelchair cushion (measurements PT)    Recommendations for Other Services OT consult     Precautions / Restrictions Precautions Precautions:  Fall Restrictions Weight Bearing Restrictions: No      Mobility  Bed Mobility Overal bed mobility: Needs Assistance Bed Mobility: Supine to Sit;Sit to Supine     Supine to sit: Supervision;HOB elevated Sit to supine: Supervision;HOB elevated   General bed mobility comments: mild increased effort to perform on own    Transfers Overall transfer level: Needs assistance Equipment used: Rolling walker (2 wheeled) Transfers: Sit to/from Stand Sit to Stand: Mod assist;Max assist;From elevated surface         General transfer comment: pt only able to come to 3/4th stand first trial standing up to RW (pt with flexed knees/posture) so pt assisted with safely sitting back down onto bed; 2nd trial standing pt able to stand upright with max assist and L knee blocked; vc's for UE/LE placement and walker use; pt hyperextending and pushing B knees against bed to stabilize in standing  Ambulation/Gait Ambulation/Gait assistance: Min assist Gait Distance (Feet):  (1 step in place B LE's) Assistive device: Rolling walker (2 wheeled)   Gait velocity: decreased   General Gait Details: pt able to take 1 step in place B LE's but opposite LE (in stance phase) with knee hyperextending and pushing against bed to stabilize to perform this  Stairs            Wheelchair Mobility    Modified Rankin (Stroke Patients Only)       Balance Overall balance assessment: Needs assistance Sitting-balance support: No upper extremity supported;Feet supported Sitting balance-Leahy Scale: Normal Sitting balance - Comments: steady sitting reaching outside BOS   Standing balance support: Bilateral upper extremity supported Standing balance-Leahy Scale: Poor Standing balance comment: pt requiring B UE support on walker and B LE's  stabilizing against bed to maintain standing                             Pertinent Vitals/Pain Pain Assessment: 0-10 Pain Score: 3  Pain Location: abdominal  (c-section) Pain Descriptors / Indicators: Sore Pain Intervention(s): Limited activity within patient's tolerance;Monitored during session;Premedicated before session;Repositioned    Home Living Family/patient expects to be discharged to:: Private residence Living Arrangements: Spouse/significant other;Children Available Help at Discharge: Family Type of Home: House       Home Layout: One level   Additional Comments: Pt reports if she stayed in [place] she would need to ask her niece if able to stay with them (1 level home with 2 STE no railing).  Otherwise pt would go back home to [place] (1 level; no steps to enter from back door; otherwise 4 STE with B railing--unable to reach both)    Prior Function Level of Independence: Independent               Hand Dominance        Extremity/Trunk Assessment   Upper Extremity Assessment Upper Extremity Assessment: Overall WFL for tasks assessed    Lower Extremity Assessment Lower Extremity Assessment: RLE deficits/detail;LLE deficits/detail RLE Deficits / Details: 0/5 ankle DF/PF/inversion/eversion; 4/5 knee flexion/extension; at least 3+/5 hip flexion; absent light touch sensation plantar surface of foot; minimal light touch sensation dorsal surface of foot and distal lateral lower leg; 2/5 attempts for great toe proprioception (5/5 attempts proprioception at ankle joint/intact above ankle); mild decreased heel to shin coordination; tone intact LE LLE Deficits / Details: 0/5 ankle DF/PF/inversion/eversion; 4/5 knee flexion/extension; at least 3+/5 hip flexion; absent light touch sensation plantar surface of foot; minimal light touch sensation dorsal surface of foot and distal lateral lower leg; 2/5 attempts for great toe proprioception (5/5 attempts proprioception at ankle joint/intact above ankle); mild decreased heel to shin coordination; tone intact LE    Cervical / Trunk Assessment Cervical / Trunk Assessment: Normal  Communication    Communication: No difficulties  Cognition Arousal/Alertness: Awake/alert Behavior During Therapy: WFL for tasks assessed/performed Overall Cognitive Status: Within Functional Limits for tasks assessed                                        General Comments   Nursing cleared pt for participation in physical therapy.  Pt agreeable to PT session.    Exercises  Transfer training   Assessment/Plan    PT Assessment Patient needs continued PT services  PT Problem List Decreased strength;Decreased balance;Decreased mobility;Decreased coordination;Decreased knowledge of use of DME;Decreased knowledge of precautions;Impaired sensation;Pain;Decreased skin integrity       PT Treatment Interventions DME instruction;Gait training;Functional mobility training;Stair training;Therapeutic activities;Therapeutic exercise;Balance training;Neuromuscular re-education;Patient/family education;Wheelchair mobility training    PT Goals (Current goals can be found in the Care Plan section)  Acute Rehab PT Goals Patient Stated Goal: to go home PT Goal Formulation: With patient Time For Goal Achievement: 12/22/20 Potential to Achieve Goals: Good    Frequency 7X/week   Barriers to discharge Decreased caregiver support      Co-evaluation               AM-PAC PT "6 Clicks" Mobility  Outcome Measure Help needed turning from your back to your side while in a flat bed without using bedrails?: None Help needed moving from lying  on your back to sitting on the side of a flat bed without using bedrails?: A Little Help needed moving to and from a bed to a chair (including a wheelchair)?: A Lot Help needed standing up from a chair using your arms (e.g., wheelchair or bedside chair)?: A Lot Help needed to walk in hospital room?: Total Help needed climbing 3-5 steps with a railing? : Total 6 Click Score: 13    End of Session Equipment Utilized During Treatment: Gait belt (up high away from  incision) Activity Tolerance: Patient tolerated treatment well Patient left: in bed;with call bell/phone within reach;with family/visitor present Nurse Communication: Mobility status;Precautions PT Visit Diagnosis: Other abnormalities of gait and mobility (R26.89);Difficulty in walking, not elsewhere classified (R26.2);Other symptoms and signs involving the nervous system (R29.898)    Time: 9767-3419 PT Time Calculation (min) (ACUTE ONLY): 60 min   Charges:   PT Evaluation $PT Eval Low Complexity: 1 Low PT Treatments $Therapeutic Activity: 23-37 mins       Hendricks Limes, PT 12/08/20, 5:06 PM

## 2020-12-08 NOTE — Progress Notes (Signed)
Post Partum Day 2  Subjective: Her issues with numbness and weakness in both feet have not changed this am. Anesthesia and Neurology have been in to evaluate.  As a result of the numbness and weakness she has remained unable to get out of bed and her foley has remained in.   Otherwise, doing well. pain managed with PO meds, and tolerating regular diet.   No fever/chills, chest pain, shortness of breath, nausea/vomiting, or leg pain. No nipple or breast pain.   Objective: BP 95/60 (BP Location: Left Arm)   Pulse 61   Temp 97.8 F (36.6 C) (Oral)   Resp 18   Ht 5\' 9"  (1.753 m)   Wt 123.4 kg   LMP 04/04/2020   SpO2 99%   Breastfeeding Unknown   BMI 40.17 kg/m    Physical Exam:  General: alert and cooperative Breasts: soft/nontender CV: RRR Pulm: nl effort Abdomen: soft, non-tender Uterine Fundus: firm Incision: dressing c/d/i Perineum: intact Lochia: appropriate DVT Evaluation: SCDs on Edinburgh:  Edinburgh Postnatal Depression Scale Screening Tool 12/06/2020  I have been able to laugh and see the funny side of things. 0  I have looked forward with enjoyment to things. 0  I have blamed myself unnecessarily when things went wrong. 2  I have been anxious or worried for no good reason. 2  I have felt scared or panicky for no good reason. 1  Things have been getting on top of me. 2  I have been so unhappy that I have had difficulty sleeping. 0  I have felt sad or miserable. 1  I have been so unhappy that I have been crying. 1  The thought of harming myself has occurred to me. 0  Edinburgh Postnatal Depression Scale Total 9     Recent Labs    12/06/20 0544 12/07/20 0637  HGB 9.7* 8.3*  HCT 29.2* 26.1*  WBC 9.3 9.5  PLT 287 207    12/07/20 Seen by Dr. 02/04/21 from Neuro Impression: This is a 25 year old previously healthy woman postpartum day 1 after repeat C-section, with nitrous oxide used during anesthesia and spinal level at L3/L4.  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  Differential includes a subclinical B12 deficiency unmasked by nitrous oxide exposure versus more urgently potential complication of spinal anesthesia such as epidural hematoma, though given the location of the spinal, would expect lower motor neuron signs in this case instead of an upper motor neuron pattern.  Given her age, transverse myelitis such as with new onset MS is also a possibility though less likely in the setting of very recent pregnancy.  Recommendations: - CMP; - B12 and MMA; - MRI C-spine, T-spine and L-spine  12/08/20 Update from Dr. 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 I already started her on supplementation yesterday. She may need rehab while she recovers and she will need Neurology follow up. I'm not sure that there will be much to add today  12/08/20 Update from Dr. 02/05/21 from Anesthesia Pt did not receive NO during surgery  Assessment/Plan: 25 y.o. 25 postpartum day # 2  -Continue routine postpartum care -Being evaluated by Neuro for weakness and numbness in feet  Started on Vitamin B-12 supplementation yesterday  PT consult today -Lactation consult PRN for breastfeeding  -RN placed social work consult for EPDS of 9 -Acute blood loss anemia - hemodynamically stable and asymptomatic; start PO ferrous sulfate BID with stool softeners    Disposition: Continue inpatient postpartum care    LOS: 2 days   Malkie Wille, CNM 12/08/2020, 9:32 AM

## 2020-12-08 NOTE — TOC Transition Note (Addendum)
Transition of Care [facility]) - CM/SW Discharge Note   Patient Details  Name: [name] MRN: [id number] Date of Birth: Jun 14, 1996  [facility] CM/SW Contact:  Ova Freshwater Phone Number: [phone] 12/08/2020, 12:13 PM   Clinical Narrative:     CSW met with patient and partner Delorise Shiner.  CSW explained role of TOC in patient care.  Patient stated she still did not have feelin gin her feet and was unable to walk.  CSW asked patient about plans after d/c.  Patient stated they would return to [place] and she would stay home with the baby. Patient stated she and her family have the resources they need and felt comfortable returning to [place]. CSW asked patient about supports and insurance for herself and the baby.  The patient stated she would contact DSS and the health department for Medicaid and [facility] when she return to [place].  CSW encouraged her to contact them now, to begin the process sooner. Patent verbalized understanding. CSW asked patient if she had any other questions.  Patient stated she and her family were considering moving to [place], and wondered how about the transfer of Medicaid.  CSW went over what she would need to do once she moved to [facility].  Patient verbalized understanding and stated she did not have anymore questions. TOC consult completed.         Patient Goals and CMS Choice        Discharge Placement                       Discharge Plan and Services                                     Social Determinants of Health (SDOH) Interventions     Readmission Risk Interventions No flowsheet data found.

## 2020-12-09 DIAGNOSIS — Z711 Person with feared health complaint in whom no diagnosis is made: Secondary | ICD-10-CM

## 2020-12-09 DIAGNOSIS — M6281 Muscle weakness (generalized): Secondary | ICD-10-CM

## 2020-12-09 DIAGNOSIS — Z9889 Other specified postprocedural states: Secondary | ICD-10-CM

## 2020-12-09 MED ORDER — METOCLOPRAMIDE HCL 5 MG/ML IJ SOLN
10.0000 mg | Freq: Four times a day (QID) | INTRAMUSCULAR | Status: DC | PRN
  Administered 2020-12-09: 10 mg via INTRAVENOUS
  Filled 2020-12-09: qty 2

## 2020-12-09 MED ORDER — SODIUM CHLORIDE 0.9 % IV SOLN
1000.0000 mg | Freq: Every day | INTRAVENOUS | Status: AC
  Administered 2020-12-09: 1000 mg via INTRAVENOUS
  Filled 2020-12-09: qty 8

## 2020-12-09 NOTE — Progress Notes (Signed)
Pt stated she feels pain in the "pit of her stomach" and nausea following administration of IV steroids. Dr. Elesa Massed made aware. New orders placed for PRN reglan.

## 2020-12-09 NOTE — Evaluation (Addendum)
Occupational Therapy Evaluation Patient Details Name: Robyn Dunn MRN: [id number] DOB: [birth year] Today's Date: 12/09/2020    History of Present Illness Pt is a 25 y.o. female s/p repeat C-section 12/06/20 with spinal anesthesia (level L3/L4) d/t rupture of membranes at 35 weeks and 1 day.  Impaired sensation and strength B feet since.  No significant findings on CTL imaging.   Clinical Impression   Ms. Robyn Dunn was seen for OT/PT co-treat/evaluation this date. Spanish interpreter Robyn Dunn [phone] utilized first part of session and Spanish interpreter Robyn Dunn [phone] utilized 2nd part of session d/t pt needing time to pump half way through session. Prior to hospital admission, pt was active and independent. Living in her home in [place] with her husband and young child. Pt visiting [place] for the holidays when she experienced a spontaneous rupture of her membranes and required emergent c-section at 35 & 1d. Infant is doing well and in room with mother. Mother is currently breast/bottle feeding and pumping. She requires set-up of all pumping supplies this date, but is able to pump while seated unsupported at EOB. She is motivated to regain full independence with ADL management and functional mobility so she can return home and continue caring for her new baby and young child. Currently pt demonstrates impairments as described below (See OT problem list) which significantly functionally limit her ability to perform ADL/self-care tasks. Pt currently requires set-up/supervision for bed level LB dressing with significant increased time/effort to don bilateral hospital socks this date. She also requires +2 MOD A for initial STS attempts with a RW, but is able to progress to +2 MIN A for stand pivot transfers to [facility] using a RW. Pt would benefit from skilled OT services to address noted impairments and functional limitations (see below for any additional details) in order to maximize safety and independence  while minimizing falls risk and caregiver burden. Upon hospital discharge, recommend acute inpatient rehabilitation services to maximize pt safety and return to PLOF.     Follow Up Recommendations  CIR    Equipment Recommendations  3 in 1 bedside commode    Recommendations for Other Services Rehab consult     Precautions / Restrictions Precautions Precautions: Fall Restrictions Weight Bearing Restrictions: No      Mobility Bed Mobility Overal bed mobility: Needs Assistance Bed Mobility: Supine to Sit;Sit to Supine     Supine to sit: Supervision;HOB elevated Sit to supine: Supervision;HOB elevated   General bed mobility comments: mild increased effort to perform on own    Transfers Overall transfer level: Needs assistance Equipment used: Rolling walker (2 wheeled) Transfers: Sit to/from [employer] Sit to Stand: Min assist;Max assist;+2 physical assistance Stand pivot transfers: Min assist;+2 physical assistance (stand step turn bed to [facility] with RW; L>R LE knee hyperextension noted)       General transfer comment: 1st trial standing from bed max assist x2; 2nd trial standing from bed and then 1st trial from [facility] min assist x2; vc's for UE/LE placement and overall technique    Balance Overall balance assessment: Needs assistance Sitting-balance support: No upper extremity supported;Feet supported Sitting balance-Leahy Scale: Normal Sitting balance - Comments: steady sitting reaching outside BOS   Standing balance support: Bilateral upper extremity supported Standing balance-Leahy Scale: Poor Standing balance comment: pt requiring B UE support on walker to maintain standing balance; initially requiring B LE's stabilizing against bed for support in standing but improved and eventually able to stand without this LE support on bed  ADL either performed or assessed with clinical judgement   ADL Overall ADL's : Needs  assistance/impaired                                       General ADL Comments: Pt is significantly functionally limited by decreased sensation, AROM, and functional use of BLE. She requires +2 assist for functional mobility over brief distances. Due to limited mobility pt requires set-up/supervision for seated UB ADL management including grooming, bathing, as well as IADL management including breast feeding and care for her new infant.     Vision Patient Visual Report: No change from baseline       Perception     Praxis      Pertinent Vitals/Pain Pain Assessment: 0-10 Pain Score: 0-No pain Pain Location: abdominal (c-section) Pain Descriptors / Indicators: Sore Pain Intervention(s): Limited activity within patient's tolerance;Monitored during session;Repositioned     Hand Dominance Right   Extremity/Trunk Assessment Upper Extremity Assessment Upper Extremity Assessment: Overall WFL for tasks assessed (No focal weakness appreciated. Grossly 4 to 5/5 t/o BUE. Minimally decreased tricep activation noted bilaterally, however pt remains WFL. Full AROM against gravity.)   Lower Extremity Assessment Lower Extremity Assessment: Defer to PT evaluation;LLE deficits/detail;RLE deficits/detail RLE Deficits / Details: Pt unable to achieve DF/PF/inversion/eversion 0/5 bilaterally. Knee flexion/extension WFL during formal testing, however decreased knee flexion appreciated with functional movility. Pt endorses bilateral sensation loss which she reports this date has advanced upward to mid calf/knee. RLE Sensation: decreased light touch;decreased proprioception RLE Coordination: decreased gross motor;decreased fine motor LLE Deficits / Details: Pt unable to achieve DF/PF/inversion/eversion 0/5 bilaterally. Knee flexion/extension WFL during formal testing, however decreased knee flexion appreciated with functional movility. Pt endorses bilateral sensation loss which she reports this  date has advanced upward to mid calf/knee. LLE Sensation: decreased proprioception;decreased light touch LLE Coordination: decreased fine motor;decreased gross motor   Cervical / Trunk Assessment Cervical / Trunk Assessment: Normal   Communication Communication Communication: No difficulties   Cognition Arousal/Alertness: Awake/alert Behavior During Therapy: WFL for tasks assessed/performed Overall Cognitive Status: Within Functional Limits for tasks assessed                                     General Comments       Exercises Other Exercises Other Exercises: Pt educated on role of OT in acute setting, safe use of AE/DME for ADL management, safe transfer techniques, and routines modifications to support safety and functional indep upon hospital DC. Other Exercises: OT facilitates set-up of breast pump equipment/supplies as pt needs to pump during session. OT/PT facilitate bed/functional mobility, STS t/fs, SPT to [facility], and functional mobility during session.   Shoulder Instructions      Home Living Family/patient expects to be discharged to:: Private residence Living Arrangements: Spouse/significant other;Children Available Help at Discharge: Family Type of Home: House       Home Layout: One level     Bathroom Shower/Tub: Tub/shower unit                    Prior Functioning/Environment Level of Independence: Independent        Comments: Pt endorses being totally independent in all ADL/IADL management at baseline. She denies falls history in the past 6 months and states she primarily spends her time caring for her other child. +  driving.        OT Problem List: Decreased strength;Decreased coordination;Impaired sensation;Decreased safety awareness;Impaired balance (sitting and/or standing);Decreased knowledge of use of DME or AE      OT Treatment/Interventions: Self-care/ADL training;Therapeutic exercise;Therapeutic activities;DME and/or AE  instruction;Patient/family education;Balance training;Neuromuscular education;Manual therapy;Modalities    OT Goals(Current goals can be found in the care plan section) Acute Rehab OT Goals Patient Stated Goal: to go home OT Goal Formulation: With patient Time For Goal Achievement: 12/23/20 Potential to Achieve Goals: Good ADL Goals Pt Will Perform Grooming: standing;with min assist;with adaptive equipment (c LRAD PRN for improved safety and functional independence.) Pt Will Perform Lower Body Dressing: sit to/from stand;with min assist;with adaptive equipment (c LRAD PRN for improved safety and functional independence.) Pt Will Transfer to Toilet: bedside commode;with min assist;ambulating;with +2 assist (c LRAD PRN for improved safety and functional independence.) Pt Will Perform Toileting - Clothing Manipulation and hygiene: sit to/from stand;with adaptive equipment;with min assist;with mod assist (c LRAD PRN for improved safety and functional independence.)  OT Frequency: Min 3X/week   Barriers to D/C: Decreased caregiver support;Inaccessible home environment          Co-evaluation PT/OT/SLP Co-Evaluation/Treatment: Yes Reason for Co-Treatment: Complexity of the patient's impairments (multi-system involvement);For patient/therapist safety;To address functional/ADL transfers PT goals addressed during session: Mobility/safety with mobility;Balance;Proper use of DME;Strengthening/ROM OT goals addressed during session: ADL's and self-care;Proper use of Adaptive equipment and DME      AM-PAC OT "6 Clicks" Daily Activity     Outcome Measure Help from another person eating meals?: A Little Help from another person taking care of personal grooming?: A Little Help from another person toileting, which includes using toliet, bedpan, or urinal?: A Lot Help from another person bathing (including washing, rinsing, drying)?: A Lot Help from another person to put on and taking off regular upper  body clothing?: A Little Help from another person to put on and taking off regular lower body clothing?: A Lot 6 Click Score: 15   End of Session Equipment Utilized During Treatment: Gait belt;Rolling walker Nurse Communication: Mobility status;Other (comment) (Pt endorsing "stinging" in B ankles.)  Activity Tolerance: Patient tolerated treatment well Patient left: in bed;with call bell/phone within reach;with bed alarm set;Other (comment) (Holding baby)  OT Visit Diagnosis: Other abnormalities of gait and mobility (R26.89);Other symptoms and signs involving the nervous system (R29.898)                Time: 2355-7322 (Time split between therapists for co-treat per protocols.) OT Time Calculation (min): 74 min Charges:  OT General Charges $OT Visit: 1 Visit OT Evaluation $OT Eval Moderate Complexity: 1 Mod OT Treatments $Self Care/Home Management : 8-22 mins  Rockney Ghee, M.S., OTR/L Ascom: [phone] 12/09/20, 1:48 PM

## 2020-12-09 NOTE — Progress Notes (Signed)
Neurology Progress Note  S: Now states sensory disturbance has progressed to mid-shin left>right.  O:  Current vital signs: BP 103/60 (BP Location: Right Arm)   Pulse 98   Temp 98 F (36.7 C) (Oral)   Resp 18   Ht $R'5\' 9"'AX$  (1.753 m)   Wt 123.4 kg   LMP 04/04/2020   SpO2 99%   Breastfeeding Unknown   BMI 40.17 kg/m  Vital signs in last 24 hours: Temp:  [97.9 F (36.6 C)-98 F (36.7 C)] 98 F (36.7 C) (01/05 1523) Pulse Rate:  [67-98] 98 (01/05 1523) Resp:  [18-20] 18 (01/05 1523) BP: (103-121)/(57-74) 103/60 (01/05 1523) SpO2:  [96 %-99 %] 99 % (01/05 1523) GENERAL: Awake, alert in NAD HEENT: Normocephalic and atraumatic, moist mm, no LN, no Thyromegally LUNGS: Clear to auscultation bilaterally with no wheezes CV: S1S2 RRR, no m/r/g, equal pulses bilaterally. ABDOMEN: Soft, nontender, nondistended with normoactive BS. Ext: warm, well perfused, intact peripheral pulses  NEURO:  Mental Status: AA&Ox3  Language: speech is normal. Naming, repetition, fluency, and comprehension intact. Cranial Nerves: PERRL 26mm/brisk. EOMI, visual fields full, no facial asymmetry, facial sensation intact, hearing intact, tongue/uvula/soft palate midline, normal sternocleidomastoid and trapezius muscle strength. No evidence of tongue atrophy or fibrillations. Motor: 5/5 upper extremities, 5/5 at the hips and thighs. 0/5 Plantar/dorsflexion, 0/5 inversion/eversion. Tone: is normal and bulk is normal. Sensation- decreased light touch bilaterally ankles--->toes. Temperature sensation intact now is impaired in L4 medial foot. Temperature sensation is also intact in the plantar surface of feet L5-S1/S2. Reflexes 2+ throughout, ~1+ ankles No Babinski L5 and above intact with vigorous withdrawal. Mild pes cavus bilaterally. Coordination: FTN intact bilaterally, heel to shin intact bilaterally. Great toe proprioception impaired bilaterally.  Gait- attempted however unsteady.   Medications  Current  Facility-Administered Medications:  .  acetaminophen (TYLENOL) tablet 1,000 mg, 1,000 mg, Oral, Q6H, Schermerhorn, Gwen Her, MD, 1,000 mg at 12/09/20 1347 .  bupivacaine liposome (EXPAREL) 1.3 % injection 266 mg, 20 mL, Infiltration, Once, Minda Meo, CNM .  coconut oil, 1 application, Topical, PRN, Schermerhorn, Gwen Her, MD .  cyanocobalamin ((VITAMIN B-12)) injection 1,000 mcg, 1,000 mcg, Intramuscular, Daily, Bhagat, Srishti L, MD, 1,000 mcg at 12/08/20 2005 .  witch hazel-glycerin (TUCKS) pad 1 application, 1 application, Topical, PRN **AND** dibucaine (NUPERCAINAL) 1 % rectal ointment 1 application, 1 application, Rectal, PRN, Schermerhorn, Gwen Her, MD .  diphenhydrAMINE (BENADRYL) capsule 25 mg, 25 mg, Oral, Q6H PRN, Schermerhorn, Gwen Her, MD .  enoxaparin (LOVENOX) injection 62.5 mg, 62.5 mg, Subcutaneous, Q24H, Schermerhorn, Gwen Her, MD, 62.5 mg at 12/09/20 1348 .  ferrous sulfate tablet 325 mg, 325 mg, Oral, BID WC, Schermerhorn, Gwen Her, MD, 325 mg at 12/09/20 0853 .  gabapentin (NEURONTIN) capsule 300 mg, 300 mg, Oral, QHS, Schermerhorn, Gwen Her, MD, 300 mg at 12/08/20 2005 .  [EXPIRED] ketorolac (TORADOL) 30 MG/ML injection 30 mg, 30 mg, Intravenous, Q6H, 30 mg at 12/07/20 0245 **FOLLOWED BY** ibuprofen (ADVIL) tablet 800 mg, 800 mg, Oral, Q6H, Schermerhorn, Gwen Her, MD, 800 mg at 12/09/20 0853 .  menthol-cetylpyridinium (CEPACOL) lozenge 3 mg, 1 lozenge, Oral, Q2H PRN, Schermerhorn, Gwen Her, MD .  metoCLOPramide (REGLAN) injection 10 mg, 10 mg, Intravenous, Q6H PRN, Ward, Honor Loh, MD, 10 mg at 12/09/20 0350 .  morphine 2 MG/ML injection 1-2 mg, 1-2 mg, Intravenous, Q3H PRN, Schermerhorn, Gwen Her, MD, 2 mg at 12/06/20 1650 .  oxyCODONE (Oxy IR/ROXICODONE) immediate release tablet 5-10 mg, 5-10 mg, Oral, Q4H PRN, Schermerhorn,  Gwen Her, MD .  prenatal multivitamin tablet 1 tablet, 1 tablet, Oral, Q1200, Schermerhorn, Gwen Her, MD, 1 tablet at 12/09/20 1347 .  senna-docusate  (Senokot-S) tablet 2 tablet, 2 tablet, Oral, Daily, Schermerhorn, Gwen Her, MD, 2 tablet at 12/09/20 [phone] .  simethicone (MYLICON) chewable tablet 80 mg, 80 mg, Oral, TID PC, Schermerhorn, Gwen Her, MD, 80 mg at 12/07/20 1629 .  simethicone (MYLICON) chewable tablet 80 mg, 80 mg, Oral, PRN, Schermerhorn, Gwen Her, MD .  sodium chloride flush (NS) 0.9 % injection 50 mL, 50 mL, Other, Once, Minda Meo, CNM .  Tdap (BOOSTRIX) injection 0.5 mL, 0.5 mL, Intramuscular, Once, Schermerhorn, Gwen Her, MD .  zolpidem (AMBIEN) tablet 5 mg, 5 mg, Oral, QHS PRN, Schermerhorn, Gwen Her, MD Labs CBC    Component Value Date/Time   WBC 9.5 12/07/2020 0637   RBC 2.78 (L) 12/07/2020 0637   HGB 8.3 (L) 12/07/2020 0637   HCT 26.1 (L) 12/07/2020 0637   PLT 207 12/07/2020 0637   MCV 93.9 12/07/2020 0637   MCH 29.9 12/07/2020 0637   MCHC 31.8 12/07/2020 0637   RDW 12.8 12/07/2020 0637    CMP     Component Value Date/Time   NA 138 12/07/2020 1344   K 3.8 12/07/2020 1344   CL 105 12/07/2020 1344   CO2 24 12/07/2020 1344   GLUCOSE 93 12/07/2020 1344   BUN 10 12/07/2020 1344   CREATININE 0.48 12/07/2020 1344   CALCIUM 8.5 (L) 12/07/2020 1344   PROT 6.3 (L) 12/07/2020 1344   ALBUMIN 2.3 (L) 12/07/2020 1344   AST 20 12/07/2020 1344   ALT 15 12/07/2020 1344   ALKPHOS 141 (H) 12/07/2020 1344   BILITOT 0.4 12/07/2020 1344   GFRNONAA >60 12/07/2020 1344   HIV (-)  Hepatitis B (-)   Imaging I have reviewed images in epic and the results pertinent to this consultation are:MRI total spine did not show acute findings within the spine. Normal appearance of the spinal cord and cauda equina. No high-grade narrowing or impingement.    Assessment: 25 year old previously healthy woman postpartum day 2 after repeat C-section with anesthesia at spinal level L3/L4. Exam showed intact bilateral hip extension, abduction (superior gluteal nerve, L5 nerve root), adduction and flexion. There seems to be mild  progression of weakness in right in knee extension and progression of sensory disturbance to mid-shin (left >right), described as loss of light touch and pressure sensation without impairment of temperature sensation. Exam with impaired foot dorsiflexion and eversion (peroneal nerve) and impaired plantar flexion and inversion (tibial nerve). Sensory exam showed decreased temperature sensation in medial foot L4 distribution however intact temperature sensation along entire plantar surface of feet. Heel to shin was intact bilaterally she had impaired proprioception at the great toe. Constellation of symptoms suggests bilateral distal sciatic nerve lesions (?) however differential diagnosis would also include B12 deficiency, copper deficiency, lyme, heavy metal, sarcoidosis, HCV, late onset atypical Charcot-Marie-Tooth disease type 2 (pes cavus/no family history) to name a few, as well as functional neurologic disorder. In light of progression and no response to 1g methylprednisolone, she will need lumbar puncture to assess CSF/serum albumin quotient and IgG index after which could consider trial of IVIg dosed at 0.4 g/kg bodyweight for 5 days.  Plan: Continue empric B12 1000 mcg IM daily x 7 days, followed by 1000 mcg PO daily, pending levels (may discontinue if levels result normal) Recommend lumbar puncture: Protein, glucose, LDH, cell count/cytology, IgG index and evaluate for albuminocytologic  dissociation. Ideally nerve conduction study would be most practical and efficient however if not possible, the patient would likely need further laboratory studies: MMA pending. Recommend ordering labs: Glycohemoglobin, Ionized calcium, TSH, ANA, ESR, CRP, RF, Homocysteine level, serum copper level, thiamine level, Anti-tissue transglutaminase antibodies, Lyme, erythrocyte transketolase activation assay/whole blood level, serum IgG antibody to GQ1b.  SPEP and UPEP with immunofixation Blood for heavy metal  assay. After LP start IVIg 0.4 g/kg bodyweight for 5 days.  Lynnae Sandhoff, MD Page: [phone]

## 2020-12-09 NOTE — Progress Notes (Signed)
Physical Therapy Treatment Patient Details Name: Robyn Dunn MRN: [id number] DOB: [birth year] Today's Date: 12/09/2020    History of Present Illness Pt is a 25 y.o. female s/p repeat C-section 12/06/20 with spinal anesthesia (level L3/L4) d/t rupture of membranes at 35 weeks and 1 day.  Impaired sensation and strength B feet since.  No significant findings on CTL imaging.    PT Comments    Pt resting in bed with OT present upon PT arrival (PT/OT co-session performed).  Strength tested:  4-/5 R hip flexion; 4/5 L hip flexion; 4-/5 B knee flexion; 4+/5 B knee extension; and 0/5 ankle DF/PF/inversion/eversion.  Sensation:  Absent light touch plantar surface B feet except minimal light touch felt mid R foot; decreased light touch sensation dorsal surface B feet; decreased light touch sensation distal lateral R lower leg and lateral L lower leg.  First time standing pt requiring max assist x2 to stand up to walker and mod assist x2 to take a few steps with RW.  Session paused d/t pt needing to pump.  Then pt able to stand from bed with min assist x2, transfer to [facility] with min assist x2, and then ambulate 5 feet with RW min assist x2.  B knees initially both hyperextending a lot with standing activities requiring cueing for posture; with increased activity during session pt's knee hyperextension improved to the point pt able to ambulate 5 feet with L knee hyperextension only when turning towards bed with RW.  No c/o pain during session.  Spanish interpreter Peyton Najjar [phone] utilized first part of session and Spanish interpreter [place] [phone] utilized 2nd part of session (d/t pt needing to pump mid session).  Will continue to focus on strengthening and progressive functional mobility during hospitalization.   Follow Up Recommendations  CIR     Equipment Recommendations  Rolling walker with 5" wheels;3in1 (PT);Wheelchair (measurements PT);Wheelchair cushion (measurements PT)     Recommendations for Other Services OT consult     Precautions / Restrictions Precautions Precautions: Fall Restrictions Weight Bearing Restrictions: No    Mobility  Bed Mobility Overal bed mobility: Needs Assistance Bed Mobility: Supine to Sit;Sit to Supine     Supine to sit: Supervision;HOB elevated (very mild increased effort to perform on own) Sit to supine: Supervision;HOB elevated (pt utilizing UE's to assist B LE's into bed)      Transfers Overall transfer level: Needs assistance Equipment used: Rolling walker (2 wheeled) Transfers: Sit to/from [employer] Sit to Stand: Min assist;Max assist;+2 physical assistance Stand pivot transfers: Min assist;+2 physical assistance (stand step turn bed to [facility] with RW; L>R LE knee hyperextension noted)       General transfer comment: 1st trial standing from bed max assist x2; 2nd trial standing from bed and then 1st trial from [facility] min assist x2; vc's for UE/LE placement and overall technique  Ambulation/Gait Ambulation/Gait assistance: Min assist;+2 physical assistance Gait Distance (Feet): 5 Feet (BSC to bed) Assistive device: Rolling walker (2 wheeled)   Gait velocity: decreased   General Gait Details: 1st trial standing pt took 1 step in place B LE's, then 1 step forward B LE's, and then 2 steps backwards with B LE's with RW use and mod assist x2 (B knees hyperextending at first but improved some with increased activity); pt ambulated 5 feet BSC to bed with RW with min assist x2 (decreased B LE step length/foot clearance/heelstrike; occasional L knee hyperextension with turning towards bed)   Stairs  Wheelchair Mobility    Modified Rankin (Stroke Patients Only)       Balance Overall balance assessment: Needs assistance Sitting-balance support: No upper extremity supported;Feet supported Sitting balance-Leahy Scale: Normal Sitting balance - Comments: steady sitting reaching outside  BOS   Standing balance support: Bilateral upper extremity supported Standing balance-Leahy Scale: Poor Standing balance comment: pt requiring B UE support on walker to maintain standing balance; initially requiring B LE's stabilizing against bed for support in standing but improved and eventually able to stand without this LE support on bed                            Cognition Arousal/Alertness: Awake/alert Behavior During Therapy: WFL for tasks assessed/performed Overall Cognitive Status: Within Functional Limits for tasks assessed                                        Exercises      General Comments   Nursing cleared pt for participation in physical therapy.  Pt agreeable to PT/OT session.  Neurologist present beginning of therapy session.      Pertinent Vitals/Pain Pain Assessment: 0-10 Pain Score: 0-No pain Pain Location: abdominal (c-section) Pain Intervention(s): Limited activity within patient's tolerance;Monitored during session;Repositioned  Vitals (HR and O2 on room air) stable and WFL throughout treatment session.    Home Living                      Prior Function            PT Goals (current goals can now be found in the care plan section) Acute Rehab PT Goals Patient Stated Goal: to go home PT Goal Formulation: With patient Time For Goal Achievement: 12/22/20 Potential to Achieve Goals: Good Progress towards PT goals: Progressing toward goals    Frequency    7X/week      PT Plan Current plan remains appropriate    Co-evaluation PT/OT/SLP Co-Evaluation/Treatment: Yes Reason for Co-Treatment: Complexity of the patient's impairments (multi-system involvement);For patient/therapist safety;To address functional/ADL transfers PT goals addressed during session: Mobility/safety with mobility;Balance;Proper use of DME;Strengthening/ROM OT goals addressed during session: ADL's and self-care;Strengthening/ROM;Proper use  of Adaptive equipment and DME      AM-PAC PT "6 Clicks" Mobility   Outcome Measure  Help needed turning from your back to your side while in a flat bed without using bedrails?: None Help needed moving from lying on your back to sitting on the side of a flat bed without using bedrails?: A Little Help needed moving to and from a bed to a chair (including a wheelchair)?: A Lot Help needed standing up from a chair using your arms (e.g., wheelchair or bedside chair)?: A Lot Help needed to walk in hospital room?: Total Help needed climbing 3-5 steps with a railing? : Total 6 Click Score: 13    End of Session Equipment Utilized During Treatment: Gait belt (up high away from abdominal incision) Activity Tolerance: Patient tolerated treatment well Patient left: in bed;with call bell/phone within reach;with bed alarm set;Other (comment);with SCD's reapplied (OT present) Nurse Communication: Mobility status;Precautions PT Visit Diagnosis: Other abnormalities of gait and mobility (R26.89);Difficulty in walking, not elsewhere classified (R26.2);Other symptoms and signs involving the nervous system (R29.898)     Time: 1052 (PT/OT co-session; session [phone], then session paused so pt able to pump, and then  session continued [phone] PT Time Calculation (min) (ACUTE ONLY): 63 min  Charges:  $Gait Training: 8-22 mins $Therapeutic Exercise: 8-22 mins $Therapeutic Activity: 8-22 mins                     Hendricks Limes, PT 12/09/20, 12:42 PM

## 2020-12-09 NOTE — Progress Notes (Signed)
Post Partum Day 3  Subjective: Her issues with numbness and weakness in both feet that is now rising up toward her knees.  As a result of the numbness and weakness she has remained unable to get out of bed and her foley has remained in.   Otherwise, doing well. pain managed with PO meds, and tolerating regular diet.   No fever/chills, chest pain, shortness of breath, nausea/vomiting, or leg pain. No nipple or breast pain.   Objective: BP (!) 103/57 (BP Location: Left Arm)   Pulse 70   Temp 97.9 F (36.6 C) (Oral)   Resp 20   Ht $R'5\' 9"'pw$  (1.753 m)   Wt 123.4 kg   LMP 04/04/2020   SpO2 96%   Breastfeeding Unknown   BMI 40.17 kg/m    Physical Exam:  General: alert and cooperative Breasts: soft/nontender CV: RRR Pulm: nl effort Abdomen: soft, non-tender Uterine Fundus: firm Incision: dressing c/d/i Perineum: intact Lochia: appropriate DVT Evaluation: SCDs on    12/07/20 Seen by Dr. Lesleigh Noe from Neuro Impression: This is a 25 year old previously healthy woman postpartum day 1 after repeat C-section, with nitrous oxide used during anesthesia and spinal level at L3/L4.  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  Differential includes a subclinical B12 deficiency unmasked by nitrous oxide exposure versus more urgently potential complication of spinal anesthesia such as epidural hematoma, though given the location of the spinal, would expect lower motor neuron signs in this case instead of an upper motor neuron pattern.  Given her age, transverse myelitis such as with new onset MS is also a possibility though less likely in the setting of very recent pregnancy.  Recommendations: - CMP; - B12 and MMA; - MRI C-spine, T-spine and L-spine  12/07/20  Vitaimin B12  1028mcg given (QD)  12/08/20 Update from Dr. 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 I already started her on supplementation yesterday. She may need rehab while she recovers and she will need Neurology follow up. I'm not sure that there will be much to add today  12/08/20 Update from Dr. Andree Elk from Anesthesia Pt did not receive NO during surgery  12/08/20 Seen by Transition of Care  12/08/20 Dr. Theda Sers from Neurology Imaging I have reviewed images in epic and the results pertinent to this consultation are:MRI total spine did not show acute findings within the spine. Normal appearance of the spinal cord and cauda equina. No high-grade narrowing or impingement.   Assessment 25 year old previously healthy woman postpartum day 2 after repeat C-section with anesthesia at spinal level L3/L4. Exam showed intact bilateral hip extension, abduction (superior gluteal nerve, L5 nerve root), adduction and flexion with impaired foot dorsiflexion and eversion (peroneal nerve) and impaired plantar flexion and inversion (tibial nerve). Sensory exam showed decreased temperature sensation in L5-S1 distribution, and though heel to shin was intact bilaterally she had impaired proprioception at the great toe. Constellation of symptoms suggests bilateral distal sciatic nerve lesions (?) however differential diagnosis would also include B12 deficiency, copper deficiency, lyme, heavy metal, sarcoidosis, HCV, late onset atypical Charcot-Marie-Tooth disease type 2 (pes cavus/no family history) to name a few, as well as functional neurologic disorder.  Continue empric B12 1000 mcg  IM daily x 7 days, followed by 1000 mcg PO daily, pending levels (may discontinue if levels result normal)  Ideally nerve conduction study would be most practical and efficient however if not possible, the patient would likely need further laboratory studies: MMA  pending. Labs: Glycohemoglobin  Ionized calcium TSH  ANA  ESR  CRP  RF  Homocysteine level Serum copper level Thiamine level  Anti-tissue transglutaminase antibodies HCV  Lyme  Erythrocyte transketolase activation assay/whole blood level  SPEP and UPEP with immunofixation  Urine/blood for heavy metals Urine/blood for porphyrins If these studies are negative she may need autoantibody panel for neuropathic syndromes less likely paraneoplastic panel.  The patient's have not progressed however if symptoms worsen may consider pulsed steroid or IVIg therapy.  12/09/19 Seen by PT PT Treatment Interventions DME instruction;Gait training;Functional mobility training;Stair training;Therapeutic activities;Therapeutic exercise;Balance training;Neuromuscular re-education;Patient/family education;Wheelchair mobility training    PT Goals (Current goals can be found in the Care Plan section)  Acute Rehab PT Goals Patient Stated Goal: to go home PT Goal Formulation: With patient Time For Goal Achievement: 12/22/20 Potential to Achieve Goals: Good   12/08/20  Vitaimin B12 1072mcg given (QD)  12/09/20 Solu-Medrol 1000mg  given (once)      Assessment/Plan: 25 y.o. [id number] postpartum day # 2  -Continue routine postpartum care -Psych consult today -OT consult today -Lactation consult PRN for breastfeeding  -Acute blood loss anemia - hemodynamically stable and asymptomatic; start PO ferrous sulfate BID with stool softeners    Disposition: Continue inpatient postpartum care    LOS: 3 days   Jakyla Reza, CNM 12/09/2020, 8:44 AM

## 2020-12-09 NOTE — Progress Notes (Signed)
Inpatient Rehab Admissions Coordinator Note:   Per therapy recommendations, pt was screened for CIR candidacy by Estill Dooms, PT, DPT.  At this time pt appears to be a candidate for CIR, but note she may be interested in pursuing rehab in [place], where she is from.  Possible that options are limited, given lack of payor source.  If pt would like to be considered for [facility] CIR, please place and IP Rehab MD Consult order.  Please contact me with questions.   Estill Dooms, PT, DPT [phone] 12/09/20 11:47 AM

## 2020-12-09 NOTE — TOC Progression Note (Signed)
Transition of Care [facility]) - Progression Note    Patient Details  Name: Robyn Dunn MRN: [id number] Date of Birth: March 12, 1996  [facility]) CM/SW Contact  Woodlynne Cellar, RN Phone Number: 12/09/2020, 4:14 PM  Clinical Narrative:    Patient remains unable to walk and was accepted at CIR. Waiting for patient to decide if she wishes to pursue CIR as she previously expressed she was planning to return to [place] for reocvery. Possible wheelchair discharge.         Expected Discharge Plan and Services                                                 Social Determinants of Health (SDOH) Interventions    Readmission Risk Interventions No flowsheet data found.

## 2020-12-09 NOTE — Progress Notes (Signed)
Pt reports new onset of increased numbness in bilateral lower extremities. Pt stated it started to get worse after she took a nap after working with physical therapy yesterday. Dr. Elesa Massed paged and made aware. New orders placed for high dose steroids and psych consult.

## 2020-12-09 NOTE — Consult Note (Signed)
[facility] Face-to-Face Psychiatry Consult   Reason for Consult: Consult because of patient complaining of possibly nonphysiologic symptoms of weakness post surgery Referring Physician: Ward Patient Identification: Robyn Dunn MRN:  [id number] Principal Diagnosis: Person with feared complaint in whom no diagnosis is made Diagnosis:  Principal Problem:   Person with feared complaint in whom no diagnosis is made Active Problems:   Preterm premature rupture of membranes in third trimester   Labor and delivery indication for care or intervention   Previous cesarean delivery affecting pregnancy   Postoperative state   Total Time spent with patient: 1 hour  Subjective:   Robyn Dunn is a 25 y.o. female patient admitted with "I can move my feet a little better".  HPI: Patient was interviewed with the assistance of a hospital provided Spanish language interpreter.  Chart reviewed.  25 year old woman who came to the hospital in labor and had a cesarean section.  Postoperatively patient has complained of weakness and numbness and some degree of paralysis in her lower extremities.  To my understanding physiologic work-up so far has been unrevealing.  Patient tells me that she feels numb down both of her legs the same on both sides.  She says her greatest concern is that she initially could not move her feet at all.  She says this seems to be getting a little bit better.  She tells me when physical therapy got her up earlier she was able to stand.  The physical therapy note looks like she needed some assistance at first but then improved.  Patient tells me she is worried about this but generally is optimistic especially because her feet are starting to move a little better.  Psychiatrically she has no specific complaint.  Reports that her mood recently has been largely fine.  When she is pregnant she did have some spells where she would cry easily but these would pass almost  immediately and she denies having had an extended period of depression.  Denies hopelessness.  Says her sleep had been adequate as her appetite had been.  Denies any psychotic symptoms.  Patient lives with her husband and has 1 young child at home already.  She stays at home with the children.  She has some family living in the area.  Patient says her relationship with her husband is good.  She does not feel like she is under a significant amount of stress.  She enjoys her role staying home with the children.  She denies having any symptoms of panic attacks.  Patient tells me she is looking forward greatly to going back home.  Denies having any fear of returning home.  Denies any alcohol or drug abuse.  Past Psychiatric History: Patient denies any past mental health history.  Never seen a psychiatrist or therapist.  No history of admissions.  No history of psychiatric medicine.  Describes brief periods of sadness which she says have always seem normal to her.  No one else has ever told her that they thought she had a problem with anxiety or depression either.  Denies any history of panic attacks or physical symptoms of anxiety.  Risk to Self: Is patient at risk for suicide?: No Risk to Others:   Prior Inpatient Therapy:   Prior Outpatient Therapy:    Past Medical History: History reviewed. No pertinent past medical history.  Past Surgical History:  Procedure Laterality Date  . CESAREAN SECTION    . CESAREAN SECTION  12/06/2020   Procedure:  CESAREAN SECTION;  Surgeon: Schermerhorn, Ihor Austin, MD;  Location: [facility] ORS;  Service: Obstetrics;;   Family History:  Family History  Problem Relation Age of Onset  . Diabetes Mother   . Diabetes Maternal Grandmother    Family Psychiatric  History: Denies knowing of any Social History:  Social History   Substance and Sexual Activity  Alcohol Use Not Currently     Social History   Substance and Sexual Activity  Drug Use Not Currently    Social  History   Socioeconomic History  . Marital status: Single    Spouse name: Oren Beckmann   . Number of children: Not on file  . Years of education: Not on file  . Highest education level: Not on file  Occupational History  . Not on file  Tobacco Use  . Smoking status: Never Smoker  . Smokeless tobacco: Never Used  Vaping Use  . Vaping Use: Never used  Substance and Sexual Activity  . Alcohol use: Not Currently  . Drug use: Not Currently  . Sexual activity: Yes  Other Topics Concern  . Not on file  Social History Narrative  . Not on file   Social Determinants of Health   Financial Resource Strain: Not on file  Food Insecurity: Not on file  Transportation Needs: Not on file  Physical Activity: Not on file  Stress: Not on file  Social Connections: Not on file   Additional Social History:    Allergies:  No Known Allergies  Labs: No results found for this or any previous visit (from the past 48 hour(s)).  Current Facility-Administered Medications  Medication Dose Route Frequency Provider Last Rate Last Admin  . acetaminophen (TYLENOL) tablet 1,000 mg  1,000 mg Oral Q6H Schermerhorn, Ihor Austin, MD   1,000 mg at 12/09/20 1347  . bupivacaine liposome (EXPAREL) 1.3 % injection 266 mg  20 mL Infiltration Once Gustavo Lah, CNM      . coconut oil  1 application Topical PRN Schermerhorn, Ihor Austin, MD      . cyanocobalamin ((VITAMIN B-12)) injection 1,000 mcg  1,000 mcg Intramuscular Daily Bhagat, Srishti L, MD   1,000 mcg at 12/08/20 2005  . witch hazel-glycerin (TUCKS) pad 1 application  1 application Topical PRN Schermerhorn, Ihor Austin, MD       And  . dibucaine (NUPERCAINAL) 1 % rectal ointment 1 application  1 application Rectal PRN Schermerhorn, Ihor Austin, MD      . diphenhydrAMINE (BENADRYL) capsule 25 mg  25 mg Oral Q6H PRN Schermerhorn, Ihor Austin, MD      . enoxaparin (LOVENOX) injection 62.5 mg  62.5 mg Subcutaneous Q24H Schermerhorn, Ihor Austin, MD   62.5 mg at 12/09/20  1348  . ferrous sulfate tablet 325 mg  325 mg Oral BID WC Schermerhorn, Ihor Austin, MD   325 mg at 12/09/20 0853  . gabapentin (NEURONTIN) capsule 300 mg  300 mg Oral QHS Schermerhorn, Ihor Austin, MD   300 mg at 12/08/20 2005  . ibuprofen (ADVIL) tablet 800 mg  800 mg Oral Q6H Schermerhorn, Ihor Austin, MD   800 mg at 12/09/20 0853  . menthol-cetylpyridinium (CEPACOL) lozenge 3 mg  1 lozenge Oral Q2H PRN Schermerhorn, Ihor Austin, MD      . metoCLOPramide (REGLAN) injection 10 mg  10 mg Intravenous Q6H PRN Ward, Elenora Fender, MD   10 mg at 12/09/20 0350  . morphine 2 MG/ML injection 1-2 mg  1-2 mg Intravenous Q3H PRN Schermerhorn, Ihor Austin, MD  2 mg at 12/06/20 1650  . oxyCODONE (Oxy IR/ROXICODONE) immediate release tablet 5-10 mg  5-10 mg Oral Q4H PRN Schermerhorn, Gwen Her, MD      . prenatal multivitamin tablet 1 tablet  1 tablet Oral Q1200 Schermerhorn, Gwen Her, MD   1 tablet at 12/09/20 1347  . senna-docusate (Senokot-S) tablet 2 tablet  2 tablet Oral Daily Schermerhorn, Gwen Her, MD   2 tablet at 12/09/20 [phone]  . simethicone (MYLICON) chewable tablet 80 mg  80 mg Oral TID PC Schermerhorn, Gwen Her, MD   80 mg at 12/07/20 1629  . simethicone (MYLICON) chewable tablet 80 mg  80 mg Oral PRN Schermerhorn, Gwen Her, MD      . sodium chloride flush (NS) 0.9 % injection 50 mL  50 mL Other Once Minda Meo, CNM      . Tdap (BOOSTRIX) injection 0.5 mL  0.5 mL Intramuscular Once Schermerhorn, Gwen Her, MD      . zolpidem (AMBIEN) tablet 5 mg  5 mg Oral QHS PRN Schermerhorn, Gwen Her, MD        Musculoskeletal: Strength & Muscle Tone: I did not do a examination of her lower extremity strength or any other specific neurologic examination.  She appeared to be moving her upper extremities and face normally. Gait & Station: See note above Patient leans: N/A  Psychiatric Specialty Exam: Physical Exam Vitals and nursing note reviewed.  Constitutional:      Appearance: She is well-developed and well-nourished.   HENT:     Head: Normocephalic and atraumatic.  Eyes:     Conjunctiva/sclera: Conjunctivae normal.     Pupils: Pupils are equal, round, and reactive to light.  Cardiovascular:     Heart sounds: Normal heart sounds.  Pulmonary:     Effort: Pulmonary effort is normal.  Abdominal:     Palpations: Abdomen is soft.  Musculoskeletal:        General: Normal range of motion.     Cervical back: Normal range of motion.  Skin:    General: Skin is warm and dry.  Neurological:     Mental Status: She is alert.     Comments: I did not ask the patient to stand or repeat any neurologic examination.  Psychiatric:        Attention and Perception: Attention normal.        Mood and Affect: Mood normal.        Speech: Speech normal.        Behavior: Behavior normal.        Thought Content: Thought content normal.        Cognition and Memory: Cognition normal.        Judgment: Judgment normal.     Review of Systems  Constitutional: Negative.   HENT: Negative.   Eyes: Negative.   Respiratory: Negative.   Cardiovascular: Negative.   Gastrointestinal: Negative.   Musculoskeletal: Negative.   Skin: Negative.   Neurological: Positive for weakness.  Psychiatric/Behavioral: Negative.     Blood pressure (!) 103/57, pulse 70, temperature 97.9 F (36.6 C), temperature source Oral, resp. rate 20, height 5\' 9"  (1.753 m), weight 123.4 kg, last menstrual period 04/04/2020, SpO2 96 %, unknown if currently breastfeeding.Body mass index is 40.17 kg/m.  General Appearance: Casual  Eye Contact:  Good  Speech:  Clear and Coherent  Volume:  Normal  Mood:  Euthymic  Affect:  Congruent  Thought Process:  Goal Directed  Orientation:  Full (Time, Place, and Person)  Thought  Content:  Logical  Suicidal Thoughts:  No  Homicidal Thoughts:  No  Memory:  Immediate;   Fair Recent;   Fair Remote;   Fair  Judgement:  Fair  Insight:  Fair  Psychomotor Activity:  Normal  Concentration:  Concentration: Fair   Recall:  [employer] of Knowledge:  Fair  Language:  Fair  Akathisia:  No  Handed:  Right  AIMS (if indicated):     Assets:  Communication Skills Desire for Improvement Housing Physical Health Resilience Social Support  ADL's:  Intact  Cognition:  WNL  Sleep:        Treatment Plan Summary: Plan 25 year old woman having atypical complaints of weakness and numbness in her lower extremities status post cesarean section.  2 examination the patient has no psychiatric complaints.  Nothing about her mental status or the interaction appeared to be abnormal or worrisome.  No past history of any mental health problems.  Denies any major trauma or stresses.  Denies any reason for not wanting to go home.  Denies substance abuse.  Patient does not have any documented past mental health history.  In short there is no mental health diagnosis at this time and there is no reason to suspect that any mental health issues are directly related to her current complaints.  No indication for any psychiatric intervention at this time.  Disposition: No evidence of imminent risk to self or others at present.   Patient does not meet criteria for psychiatric inpatient admission. Supportive therapy provided about ongoing stressors.  Mordecai Rasmussen, MD 12/09/2020 2:02 PM

## 2020-12-10 LAB — TSH: TSH: 1.492 u[IU]/mL (ref 0.350–4.500)

## 2020-12-10 LAB — CBC
HCT: 26.9 % — ABNORMAL LOW (ref 36.0–46.0)
Hemoglobin: 8.7 g/dL — ABNORMAL LOW (ref 12.0–15.0)
MCH: 29.9 pg (ref 26.0–34.0)
MCHC: 32.3 g/dL (ref 30.0–36.0)
MCV: 92.4 fL (ref 80.0–100.0)
Platelets: 310 10*3/uL (ref 150–400)
RBC: 2.91 MIL/uL — ABNORMAL LOW (ref 3.87–5.11)
RDW: 12.5 % (ref 11.5–15.5)
WBC: 12.6 10*3/uL — ABNORMAL HIGH (ref 4.0–10.5)
nRBC: 0 % (ref 0.0–0.2)

## 2020-12-10 LAB — CREATININE, SERUM
Creatinine, Ser: 0.63 mg/dL (ref 0.44–1.00)
GFR, Estimated: >60 mL/min (ref >60)

## 2020-12-10 NOTE — Progress Notes (Signed)
Patient ID: Robyn Dunn, female   DOB: Sep 21, 1996, 25 y.o.   MRN: [id number] I spoke with Dr Durene Cal , neurologist , who is managing her neurologic issues . Currently he feels this is a functional issue and her weakness and paresthesia are slowly improving . He is going to hold on LP and he may order some of the labs that he previously listed . Still looking for a bed at [facility] rehab facility .( one may open tomorrow). From an OB standpoint she is stable and ready for d/c .

## 2020-12-10 NOTE — Progress Notes (Addendum)
Patient has been sitting up in recliner since working with PT this morning.   Per PT: "10 feet with RW (limited distance d/t R knee buckling) and then 40 feet with RW; occasional L knee hyperextension; B LE steppage type gait d/t B foot drop; heavy UE support on RW"  Patient is cleared for discharge from OB stand point.   Neurology to assess patient this afternoon to determine need for LP/IVIG or if patient is stable/making progress and ready for CIR.   Verbal order to remove foley catheter now that patient is able to get OOB w/ two assist.

## 2020-12-10 NOTE — Progress Notes (Signed)
Inpatient Rehabilitation Admissions Coordinator  Inpatient rehab consult for CIR at [facility] in [place] received. I will meet with patient at bedside later today for assessment of her candidacy for a possible Cir admit. Noted Neurology workup in progress.  Ottie Glazier, RN, MSN Rehab Admissions Coordinator [phone] 12/10/2020 1:27 PM

## 2020-12-10 NOTE — Progress Notes (Signed)
Inpatient Rehabilitation Admissions Coordinator   Inpatient rehab consult received, I met at bedside with patient using [employer] interpreter Donald Prose [phone]. I discussed goals and expectations of an inpt rehab admit at [facility] in [place]. I currently do not have a bed into sometime next week even possible for her admission depending on her functional progress. Patient states her husband has to return to [place] due to work and she has no one local to care for her infant and 88 year old. Her sister is in [place] who can assist with the children and patient when spouse works. I explained that her options are #1 remain in CIR/inpt rehab [facility] in [place] without her children,  #2 return to [place] with spouse and children to have follow up care medically and with her sister. I have asked her to discuss with her spouse and I will follow up with patient by phone with use of Spanish interpreter tomorrow. I discussed with her Nurse.  Danne Baxter, RN, MSN Rehab Admissions Coordinator [phone] 12/10/2020 5:09 PM

## 2020-12-10 NOTE — TOC Progression Note (Addendum)
Transition of Care [facility]) - Progression Note    Patient Details  Name: Robyn Dunn MRN: [id number] Date of Birth: 08-Oct-1996  Transition of Care [facility] CM/SW Contact  [place] Cellar, RN Phone Number: 12/10/2020, 11:35 AM  Clinical Narrative:    Patient in agreeance with CIR however not medically cleared for transfer. TOC will follow.         Expected Discharge Plan and Services                                                 Social Determinants of Health (SDOH) Interventions    Readmission Risk Interventions No flowsheet data found.

## 2020-12-10 NOTE — Anesthesia Post-op Follow-up Note (Incomplete)
  Anesthesia Pain Follow-up Note  Patient: Robyn Dunn  Day #: {NUMBER [id number]:[id number]}  Date of Follow-up: 12/10/2020 Time: 8:07 PM  Last Vitals:  Vitals:   12/10/20 0830 12/10/20 1421  BP: 117/76 106/72  Pulse: 64 65  Resp: 17 18  Temp: 36.9 C 36.5 C  SpO2: 100% 100%    Level of Consciousness: {Findings; LOC exam:17915::"alert"}  Pain: {Desc; pain severity:12299}   Side Effects:{CHL AN SIDE EFFECTS:22537}  Catheter Site Exam:{Exam; wound:13612}  Anti-Coag Meds (From admission, onward)   Start     Dose/Rate Route Frequency Ordered Stop   12/07/20 1200  enoxaparin (LOVENOX) injection 62.5 mg        62.5 mg Subcutaneous Every 24 hours 12/06/20 1336         Plan: {CHL AN PAIN ASSESSMENT / NHAF:79038}  Yevette Edwards

## 2020-12-10 NOTE — Progress Notes (Signed)
Patient assisted x2 RN to [facility] from recliner (taking a couple of steps) then from [facility] to bed (taking a couple more steps). Tolerated well, no c/o pain just "tingles". Voided 100 ml first attempt post-foley catheter removal.

## 2020-12-10 NOTE — Addendum Note (Signed)
Addendum  created 12/10/20 2026 by Yevette Edwards, MD   Pend clinical note

## 2020-12-10 NOTE — Progress Notes (Signed)
Brief progress note  Pt is a 25 y.o. female s/p repeat C-section 12/06/20 with spinal anesthesia (level L3/L4) d/t rupture of membranes at 35 weeks and 1 day.  Impaired sensation and strength B feet since.  No significant findings on CTL imaging.   Discussed with PT and the patient has made progress - was able to ambulate10 feet with RW (limited distance d/t R knee buckling) and then 40 feet with RW; occasional L knee hyperextension; B LE steppage type gait d/t B foot drop with spared proprioception at the ankles.  R LE able to minimally contract muscles for slight movement (to wiggle toes, DF, and inversion).    Based on exam findings and improvement there is less concern for GBS and patient would not need lumbar puncture or IVIg treatment at this time. The patient would benefit more from inpatient rehabilitation.  Labs ordered: TSH, ANA, ESR, CRP, ACE, homocysteine, serum copper, thiamine, vitamin E, anti-tissue transglutaminase antibodies,Lyme, heavy metal assay, anti-GQ1b IgG antibody, SPEP with immunofixation.

## 2020-12-10 NOTE — Progress Notes (Signed)
Physical Therapy Treatment Patient Details Name: Robyn Dunn MRN: [id number] DOB: March 29, 1996 Today's Date: 12/10/2020    History of Present Illness Pt is a 25 y.o. female s/p repeat C-section 12/06/20 with spinal anesthesia (level L3/L4) d/t rupture of membranes at 35 weeks and 1 day.  Impaired sensation and strength B feet since.  No significant findings on CTL imaging.    PT Comments    Pt resting in bed upon PT arrival; agreeable to PT session.  Spanish interpreter Jaci Carrel [phone] utilized for entire PT session (via [employer] system).  No change noted with L LE strength; R LE pt able to minimal contract muscles for slight movement (to wiggle toes, DF, and inversion).  Pt reporting 5/10 B LE pain beginning of session at rest; no pain while ambulating; mild tingling in feet immediately post ambulation; but no pain LE's end of session at rest in recliner (pt did report 3/10 abdominal pain from c-section).  SBA semi-supine to sitting edge of bed.  Min assist x2 to stand up to walker (pt able to initiate to stand fairly well but requiring assist from mid-stand to full stand up to walker).  Then pt able to walk 10 feet with RW CGA to min assist x1 (with 2nd assist for safety and equipment)--limited distance d/t R knee buckling.  Then pt able to ambulate 40 feet with RW CGA to min assist x1 (with 2nd assist for safety and equipment).  B steppage type gait noted d/t B foot drop.  Pt reporting that if she did not have her knees in a certain position standing/walking, they felt like they did not have any strength (and would buckle).  Heavy UE support on walker noted with ambulation.  Nurse notified of pt's assist levels and functional mobility abilities (recommending initial 2 person nursing assist with [facility] transfers)  Pt continues to be very motivated to participate in therapy and improve independence with functional mobility.    Follow Up Recommendations  CIR     Equipment  Recommendations  Rolling walker with 5" wheels;3in1 (PT);Wheelchair (measurements PT);Wheelchair cushion (measurements PT)    Recommendations for Other Services OT consult     Precautions / Restrictions Precautions Precautions: Fall Restrictions Weight Bearing Restrictions: No    Mobility  Bed Mobility Overal bed mobility: Needs Assistance Bed Mobility: Supine to Sit     Supine to sit: Supervision;HOB elevated     General bed mobility comments: mild increased effort to perform on own  Transfers Overall transfer level: Needs assistance Equipment used: Rolling walker (2 wheeled) Transfers: Sit to/from Stand Sit to Stand: Min assist;+2 physical assistance         General transfer comment: x1 trial standing from bed and x1 trial standing from recliner; vc's for UE/LE placement and overall technique with sit to/from stands  Ambulation/Gait Ambulation/Gait assistance: Min guard;Min assist;+2 safety/equipment Gait Distance (Feet):  (10 feet; 40 feet) Assistive device: Rolling walker (2 wheeled)   Gait velocity: decreased   General Gait Details: 10 feet with RW (limited distance d/t R knee buckling) and then 40 feet with RW; occasional L knee hyperextension; B LE steppage type gait d/t B foot drop; heavy UE support on RW   Stairs             Wheelchair Mobility    Modified Rankin (Stroke Patients Only)       Balance Overall balance assessment: Needs assistance Sitting-balance support: No upper extremity supported;Feet supported Sitting balance-Leahy Scale: Normal Sitting balance - Comments:  steady sitting reaching outside BOS   Standing balance support: Bilateral upper extremity supported Standing balance-Leahy Scale: Poor Standing balance comment: pt requiring heavy UE support on RW for static standing balance                            Cognition Arousal/Alertness: Awake/alert Behavior During Therapy: WFL for tasks  assessed/performed Overall Cognitive Status: Within Functional Limits for tasks assessed                                        Exercises      General Comments   Nursing cleared pt for participation in physical therapy.  Pt agreeable to PT session.      Pertinent Vitals/Pain Pain Assessment: 0-10 Pain Score: 3  Pain Location: abdominal (c-section) Pain Descriptors / Indicators: Sore Pain Intervention(s): Limited activity within patient's tolerance;Monitored during session;Premedicated before session;Repositioned  Vitals (HR and O2 on room air) stable and WFL throughout treatment session.    Home Living                      Prior Function            PT Goals (current goals can now be found in the care plan section) Acute Rehab PT Goals Patient Stated Goal: to go home PT Goal Formulation: With patient Time For Goal Achievement: 12/22/20 Potential to Achieve Goals: Good Progress towards PT goals: Progressing toward goals    Frequency    7X/week      PT Plan Current plan remains appropriate    Co-evaluation              AM-PAC PT "6 Clicks" Mobility   Outcome Measure  Help needed turning from your back to your side while in a flat bed without using bedrails?: None Help needed moving from lying on your back to sitting on the side of a flat bed without using bedrails?: A Little Help needed moving to and from a bed to a chair (including a wheelchair)?: A Lot Help needed standing up from a chair using your arms (e.g., wheelchair or bedside chair)?: A Lot Help needed to walk in hospital room?: A Lot Help needed climbing 3-5 steps with a railing? : Total 6 Click Score: 14    End of Session Equipment Utilized During Treatment: Gait belt (up high away from abdominal incision) Activity Tolerance: Patient tolerated treatment well Patient left: in chair;with call bell/phone within reach;Other (comment);with SCD's reapplied (B heels floating  via pillow support) Nurse Communication: Mobility status;Precautions.  Nurse aware pt up in chair (and no chair alarm present to utilize)--pt educated via interpreter assist to not get up on her own--pt educated to call and wait for nursing assist for mobility--pt verbalizing understanding. PT Visit Diagnosis: Other abnormalities of gait and mobility (R26.89);Difficulty in walking, not elsewhere classified (R26.2);Other symptoms and signs involving the nervous system (R29.898)     Time: 7628-3151 PT Time Calculation (min) (ACUTE ONLY): 53 min  Charges:  $Gait Training: 23-37 mins $Therapeutic Exercise: 8-22 mins $Therapeutic Activity: 8-22 mins                    Arben Packman, PT 12/10/20, 11:08 AM

## 2020-12-10 NOTE — Progress Notes (Signed)
Post OP Day 4  Subjective: Resting in bed.  Foley cath in situ d/t unable to ambulate.  Reporting in her feet L>R.  Left foot has constant pain and right foot has pain with palpitation.  She reports normal sensation at shin level.    No fever/chills, chest pain, shortness of breath, nausea/vomiting, or leg pain. No nipple or breast pain. No headache, visual changes, or RUQ/epigastric pain.  Objective: BP 117/76 (BP Location: Left Arm)   Pulse 64   Temp 98.4 F (36.9 C) (Oral)   Resp 17   Ht _0  (1.753 m)   Wt 123.4 kg   LMP 04/04/2020   SpO2 100%   Breastfeeding Unknown   BMI 40.17 kg/m    Physical Exam:  General: alert and cooperative Breasts: soft/nontender   CV: RRR Pulm: nl effort, CTABL Abdomen: soft, non-tender, active bowel sounds Uterine Fundus: firm Incision: healing well Perineum: minimal edema, intact Lochia: appropriate DVT Evaluation: No evidence of DVT seen on physical exam.  Recent Labs    12/10/20 0254  HGB 8.7*  HCT 26.9*  WBC 12.6*  PLT 310   12/07/20 Seen by Dr. Lesleigh Noe from Neuro Impression:This is a 25 year old previously healthy woman postpartum day 1 after repeat C-section, with nitrous oxide used during anesthesia and spinal level at L3/L4. 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 Differential includes a subclinical B12 deficiency unmasked by nitrous oxide exposure versus more urgently potential complication of spinal anesthesia such as epidural hematoma, though given the location of the spinal, would expect lower motor neuron signs in this case instead of an upper motor neuron pattern.Given her age, transverse myelitis such as with new onset MS is also a possibility though less likely in the setting of very recent  pregnancy.  Recommendations: - CMP; - B12 and MMA; -MRI C-spine, T-spine and L-spine  12/07/20  Vitaimin B12 1020mg given (QD)  12/08/20 Update from Dr. 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 I already started her on supplementation yesterday. She may need rehab while she recovers and she will need Neurology follow up. I'm not sure that there will be much to add today  12/08/20 Update from Dr. AAndree Elkfrom Anesthesia Pt did not receive NO during surgery  12/08/20 Seen by Transition of Care  12/08/20 Dr. CTheda Sersfrom Neurology Imaging I have reviewed images in epic and the results pertinent to this consultation are:MRI total spine did not show acute findings within the spine. Normal appearance of the spinal cord and cauda equina. No high-grade narrowing or impingement.  Assessment 25year old previously healthy woman postpartum day2after repeat C-section with anesthesia atspinal level L3/L4.Exam showed intact bilateral hip extension, abduction (superior gluteal nerve, L5 nerve root), adduction and flexion with impaired foot dorsiflexion and eversion (peroneal nerve)and impairedplantar flexion and inversion (tibial nerve). Sensory exam showed decreased temperature sensation in L5-S1 distribution, and though heel to shin was intact bilaterally she had impaired proprioception at the great toe. Constellation of symptoms suggests bilateral distalsciatic nervelesions (?) however differential diagnosis would also include B12 deficiency, copper deficiency,lyme,heavy metal, sarcoidosis,HCV,late onset atypicalCharcot-Marie-Tooth disease type 2(pes cavus/no family history)to name a few, as well as functional neurologic disorder.  Continue empric B12 1000  mcg IM daily x 7 days, followed by 1000 mcg PO daily, pending levels (may discontinue if levels result normal)  Ideally nerve conduction study would be  most practical and efficient however if not possible, the patient would likely need further laboratory studies: MMA pending. Labs: Glycohemoglobin Ionized calcium TSH  ANA  ESR  CRP  RF  Homocysteine level Serum copper level Thiamine level Anti-tissue transglutaminase antibodies HCV  Lyme  Erythrocyte transketolase activation assay/whole blood level  SPEP and UPEP with immunofixation  Urine/blood for heavy metals Urine/blood for porphyrins If these studies are negative she may need autoantibody panel for neuropathic syndromes less likely paraneoplastic panel.  The patient's have not progressed however if symptoms worsen may consider pulsed steroid or IVIg therapy.  12/09/19 Seen by PT PT Treatment Interventions DME instruction;Gait training;Functional mobility training;Stair training;Therapeutic activities;Therapeutic exercise;Balance training;Neuromuscular re-education;Patient/family education;Wheelchair mobility training   PT Goals (Current goals can be found in the Care Plan section)  Acute Rehab PT Goals Patient Stated Goal: to go home PT Goal Formulation: With patient Time For Goal Achievement: 12/22/20 Potential to Achieve Goals: Good   12/08/20  Vitaimin B12 1046mg given (QD)  12/09/20 Solu-Medrol 10024mgiven (once)  12/10/20 Seen by PT  PT Goals (current goals can now be found in the care plan section) Acute Rehab PT Goals Patient Stated Goal: to go home PT Goal Formulation: With patient Time For Goal Achievement: 12/22/20 Potential to Achieve Goals: Good Progress towards PT goals: Progressing toward goals    Frequency    7X/week      PT Plan Current plan remains appropriate    Assessment/Plan: [age].o. [id number] day # 4  -Continue routine postpartum care, plan for transfer to CIR  -Lactation consult PRN for breastfeeding    -Acute blood loss anemia - hemodynamically stable and asymptomatic; continue PO ferrous sulfate BID  with stool softeners  -Immunization status: Needs varicella prior to discharge -Has been accepted to CIR -> she would like to continue care with them -PT has evaluated KaFlorentina Jenny Recommend PT 7x week, she was able to ambulate 10 feet with assistance -Plan to d/c foley cath - use bedside commode with assist  -Pending Neurology follow up today to complete medical clearance for CIR.   Disposition: Stable from an obstetric standpoint for discharge. [facility]     LOS: 4 days   AnMinda MeoCNM 12/10/2020, 8:50 AM   ----- AnDrinda ButtsCertified Nurse Midwife KeBeaverlOregon State Hospital[facility]

## 2020-12-10 NOTE — Progress Notes (Signed)
Patient c/o pain in bilateral feet when touched on assessment and pain on the lateral part of lower extremities although still states her feet are numb but has pain. Scheduled Motrin and PRN Roxi given, will reassess.   Patient also states she is willing to go to CIR for physical therapy rehab. TOC made aware in Epic chat and CNM on unit while rounding.  PT just arrived to unit to work with patient.

## 2020-12-10 NOTE — Progress Notes (Signed)
Spoke with patient today via online interpreter assistance and nurse.  Patient reports some tingling sensation in the right foot and able to recover some return of motion at the ankles.  Was able to ambulate with assistance with PT as well.  No pain reported.  Still feels weak in the bilateral ankles.  Foley was DC'd and patient able to void with control.  On exam: 2/5 plantar flexion and dorsiflexion right ankle and 1/5 left ankle.  5/5 extension at the bilateral knee and 4/5 flexion at the bilateral knee.  Hip flexors and extensors intact  Neurology following and ancillary assistance much appreciated.  Steroids may continue to be of benefit if an inflammatory process but will defer to neurology.  Dr. Pernell Dupre

## 2020-12-11 LAB — SEDIMENTATION RATE: Sed Rate: 77 mm/hr — ABNORMAL HIGH (ref 0–22)

## 2020-12-11 NOTE — Progress Notes (Signed)
Post Partum Day 5 Subjective: Doing well, no complaints.  Tolerating regular diet, pain with PO meds, voiding without difficulty. Continues to have neuro issues, being followed by Neuro and TOC.   No CP SOB Fever,Chills, N/V or leg pain; denies nipple or breast pain, no HA change of vision, RUQ/epigastric pain  Objective: BP 112/67 (BP Location: Left Arm)   Pulse 62   Temp 97.7 F (36.5 C) (Oral)   Resp 18   Ht 5\' 9"  (1.753 m)   Wt 123.4 kg   LMP 04/04/2020   SpO2 100%   Breastfeeding Unknown   BMI 40.17 kg/m    Physical Exam:  General: NAD Breasts: soft/nontender, pumping for infant.  CV: RRR Pulm: nl effort, CTABL Abdomen: soft, NT, BS x 4 Incision: Honeycomb Dsg CDI, no erythema or drainage Lochia: scant Uterine Fundus: fundus firm and 2 fb below umbilicus DVT Evaluation: no cords, ttp LEs   Recent Labs    12/10/20 0254  HGB 8.7*  HCT 26.9*  WBC 12.6*  PLT 310    Assessment/Plan: 25 y.o. 02/07/21 postpartum day # 5  - Continue routine PP care- ready for DC from OB standpoint.  - Neuro and TOC managing care related to paresthesia issues.  - Lactation consult prn - Acute blood loss anemia - hemodynamically stable and asymptomatic; start po ferrous sulfate BID with stool softeners  - Immunization status: Needs varicella prior to DC  Disposition: Does not desire Dc home today.    [id number], CNM 12/11/2020  9:43 AM

## 2020-12-11 NOTE — TOC Progression Note (Signed)
Transition of Care [facility]) - Progression Note    Patient Details  Name: Robyn Dunn MRN: [id number] Date of Birth: [birth year]  [facility]) CM/SW Contact  Tennyson Cellar, RN Phone Number: 12/11/2020, 1:12 PM  Clinical Narrative:    Patient has been medically cleared for discharge however expressed concerns to CIR staff that her husband had to return to [place] for work and she had nobody to care for her children. Patient was waiting for CIR bed however could be possibly next week before one is available. CIR is planning to follow up with patient today to confirm if patient will be returning to [place] with her husband.         Expected Discharge Plan and Services                                                 Social Determinants of Health (SDOH) Interventions    Readmission Risk Interventions No flowsheet data found.

## 2020-12-11 NOTE — TOC Progression Note (Signed)
Transition of Care [facility]) - Progression Note    Patient Details  Name: Robyn Dunn MRN: [id number] Date of Birth: 1996-11-17  Transition of Care [facility]) CM/SW Contact  Gilberton Cellar, RN Phone Number: 12/11/2020, 9:07 AM  Clinical Narrative:    Updated by staff RN CIR is doing another assessment for official bed offer however current wait time is 1-2 weeks. Will follow for bed offer and discuss with patient/family other options.         Expected Discharge Plan and Services                                                 Social Determinants of Health (SDOH) Interventions    Readmission Risk Interventions No flowsheet data found.

## 2020-12-11 NOTE — Progress Notes (Signed)
Inpatient Rehabilitation Admissions Coordinator  I do not have a CIR bed for rehab at [facility] today or through the weekend. I have alerted acute team, and TOC.I will follow her progress with therapies over the weekend to assist with planning dispo options.   I spoke via conference call with patient, her spouse and her Nurse with interpretor to discuss patient and spouse needs and preference for further rehab. Infant would stay local with niece as her local caregiver. Spouse will return to [place] for work. I will follow up on Monday to assess her therapy progress and to clarify if I would have  CIR bed to admit her to next week.  Ottie Glazier, RN, MSN Rehab Admissions Coordinator [phone] 12/11/2020 12:18 PM

## 2020-12-11 NOTE — Progress Notes (Signed)
PT Cancellation Note  Patient Details Name: Robyn Dunn MRN: [id number] DOB: June 20, 1996   Cancelled Treatment:    Reason Eval/Treat Not Completed: Other (comment).  Upon PT arrival, pt sitting edge of bed feeding her baby (pt not available for PT session).  Will re-attempt PT treatment session at a later date/time.  Hendricks Limes, PT 12/11/20, 9:26 AM

## 2020-12-11 NOTE — Progress Notes (Signed)
Brief progress note  Pt is a 25 y.o. female s/p repeat C-section 12/06/20 with spinal anesthesia (level L3/L4) d/t rupture of membranes at 35 weeks and 1 day.  Impaired sensation and strength B feet since.  No significant findings on CTL imaging.  12-10-2020: Discussed with PT and the patient has made progress - was able to ambulate10 feet with RW (limited distance d/t R knee buckling) and then 40 feet with RW; occasional L knee hyperextension; B LE steppage type gait d/t B foot drop with spared proprioception at the ankles.  R LE able to minimally contract muscles for slight movement (to wiggle toes, DF, and inversion).   Exam today reveals improved right foot inversion and exquisite reaction to cold sensation in all dermatomes of shins, ankles and feet (dorsi/plantar).Nursing staff reported patient had dorsiflexed, inverted then everted her left foot this morning which she could not do on my exam.  Labs  Ref. Range 12/10/2020 18:59  TSH Latest Ref Range: 0.350 - 4.500 uIU/mL 1.492    Ref. Range 12/10/2020 18:59  Sed Rate Latest Ref Range: 0 - 22 mm/hr 77 (H)   Pending labs ordered: ANA, CRP, ACE, homocysteine, serum copper, thiamine, vitamin E, anti-tissue transglutaminase antibodies,Lyme, heavy metal assay, anti-GQ1b IgG antibody, SPEP with immunofixation.  Based on fluctuatingn exam findings and continued improvement will not purse lumbar puncture or any further treatment at this time. Continue empric B12 1000 mcg which may be switched to PO daily. Lab results are still pending and several will take over a month to result however, the associated diseases are not emergent/life threatening and evaluation may be continued as an outpatient.  The patient would derive the most benefit from inpatient rehabilitation and lab results may be followed up during the interim.   Robyn Humble, MD Page: [phone]

## 2020-12-11 NOTE — Progress Notes (Signed)
Physical Therapy Treatment Patient Details Name: Robyn Dunn MRN: [id number] DOB: [birth year] Today's Date: 12/11/2020    History of Present Illness Pt is a 25 y.o. female s/p repeat C-section 12/06/20 with spinal anesthesia (level L3/L4) d/t rupture of membranes at 35 weeks and 1 day.  Impaired sensation and strength B feet since.  No significant findings on CTL imaging.    PT Comments    Pt ready and motivated for session.  To EOB with no assist.  Stood with min a x 1 and is able to progress gait 40' x 2 with RW and heavy reliance on BUE for support but no buckling today.  Recliner follow for safety.  She initially has very cautious step to gait pattern but on 2nd attempt is able to self initiate progression to step-through gait pattern.  After seated rest, she is able to complete multiple rounds of sit to stand transfers and transfers from bed to recliner with RW.  Standing ex 2 x 10 and mini-squats all to focus on BLE strength, balance and activity tolerance.  She is fatigued after 45 minutes of activity.    Pt is making progress towards goals but continues with very poor AROM B ankles.  She is not at baseline and is unable to care for her new infant at her current level of mobility.  CIR remains appropriate.  If discharge is necessary she will require a wheelchair and RW upon discharge.  +1/2 assist for all standing mobility.   Patient suffers from BLE weakness which impairs his/her ability to perform daily activities like toileting, feeding, dressing, grooming, bathing in the home. A cane, walker, crutch will not resolve the patient's issue with performing activities of daily living. A lightweight wheelchair and cushion is required/recommended and will allow patient to safely perform daily activities.   Patient can safely propel the wheelchair in the home or has a caregiver who can provide assistance.    Follow Up Recommendations  CIR     Equipment Recommendations  Rolling  walker with 5" wheels;3in1 (PT);Wheelchair (measurements PT);Wheelchair cushion (measurements PT)    Recommendations for Other Services       Precautions / Restrictions Precautions Precautions: Fall Restrictions Weight Bearing Restrictions: No    Mobility  Bed Mobility Overal bed mobility: Needs Assistance Bed Mobility: Supine to Sit     Supine to sit: Supervision;HOB elevated Sit to supine: Supervision;HOB elevated   General bed mobility comments: mild increased effort to perform on own  Transfers Overall transfer level: Needs assistance Equipment used: Rolling walker (2 wheeled) Transfers: Sit to/from Stand Sit to Stand: Min assist;Min guard Stand pivot transfers: Min assist;+2 physical assistance       General transfer comment: emphasis on sit to stand transfers and transfers to recliner with walker  Ambulation/Gait Ambulation/Gait assistance: Min guard;Min assist;+2 safety/equipment Gait Distance (Feet): 40 Feet Assistive device: Rolling walker (2 wheeled) Gait Pattern/deviations: Step-to pattern;Step-through pattern Gait velocity: decreased   General Gait Details: 40' x 2 initially step to gait progressig to step through   [employer]    Modified Rankin (Stroke Patients Only)       Balance Overall balance assessment: Needs assistance Sitting-balance support: No upper extremity supported;Feet supported Sitting balance-Leahy Scale: Normal Sitting balance - Comments: steady sitting reaching outside BOS   Standing balance support: Bilateral upper extremity supported Standing balance-Leahy Scale: Fair Standing balance comment: heavy support on walker but no buckling  today                            Cognition Arousal/Alertness: Awake/alert Behavior During Therapy: WFL for tasks assessed/performed;Flat affect Overall Cognitive Status: Within Functional Limits for tasks assessed                                         Exercises Other Exercises Other Exercises: OT facilitates static/dynamic sitting and standing activity to support weight bearing through BLE, functional reach, and participation in standing ADL management at sink. Pt/caregiver also education on strategies to maintain regular sleep/wake cycles while in the hospital.    General Comments        Pertinent Vitals/Pain Pain Assessment: Faces Faces Pain Scale: Hurts a little bit Pain Location: ankles with WB especially with SLS activities Pain Descriptors / Indicators: Sore Pain Intervention(s): Limited activity within patient's tolerance;Monitored during session;Repositioned    Home Living Family/patient expects to be discharged to:: Private residence                    Prior Function            PT Goals (current goals can now be found in the care plan section) Acute Rehab PT Goals Patient Stated Goal: to go home Progress towards PT goals: Progressing toward goals    Frequency    7X/week      PT Plan Current plan remains appropriate    Co-evaluation              AM-PAC PT "6 Clicks" Mobility   Outcome Measure  Help needed turning from your back to your side while in a flat bed without using bedrails?: None Help needed moving from lying on your back to sitting on the side of a flat bed without using bedrails?: None Help needed moving to and from a bed to a chair (including a wheelchair)?: A Little Help needed standing up from a chair using your arms (e.g., wheelchair or bedside chair)?: A Little Help needed to walk in hospital room?: A Little Help needed climbing 3-5 steps with a railing? : Total 6 Click Score: 18    End of Session Equipment Utilized During Treatment: Gait belt Activity Tolerance: Patient tolerated treatment well Patient left: in bed;with call bell/phone within reach Nurse Communication: Mobility status;Precautions       Time: 7517-0017 PT Time Calculation  (min) (ACUTE ONLY): 44 min  Charges:  $Gait Training: 8-22 mins $Therapeutic Exercise: 8-22 mins $Therapeutic Activity: 8-22 mins                    Danielle Dess, PTA 12/11/20, 3:19 PM

## 2020-12-11 NOTE — Progress Notes (Signed)
Occupational Therapy Treatment Patient Details Name: Robyn Dunn MRN: [id number] DOB: 12-04-1996 Today's Date: 12/11/2020    History of present illness Pt is a 25 y.o. female s/p repeat C-section 12/06/20 with spinal anesthesia (level L3/L4) d/t rupture of membranes at 35 weeks and 1 day.  Impaired sensation and strength B feet since.  No significant findings on CTL imaging.   OT comments  Pt seen for OT treatment on this date. Upon arrival to room pt room, pt awake/alert, she denies pain, and is eager to participate in therapy session. Pt states her sensation in BLE remains about the same as it was on the previous date. She states that her ankles "feel tingly" after therapy sessions, but otherwise remain totally numb with no active PF/DF appreciated. Pt is able to come to sitting at EOB with supervision and increased use of bed rails. She performs STS with +1 MIN A and cueing for hand/foot placement. Pt noted to position feet for standing using B arms this date. She stands with initial instability requiring MOD A to maintain standing balance with BUE support on RW. Static/dynamic standing tasks including weight shifting, mini-squats, functional reach within/outside BOS. She is able to take short steps from EOB to sink this date for simulated standing ADL management. Pt continues to require consistent assistance to maintain standing balance despite use of RW for stability. Increased R knee buckling noted withy functional reach across midline. Pt remains eager to engage in as much mobility as possible, however she is noted with flat affect this date as compared to previous sessions. Pt/caregiver (spouse at bedside) educated on strategies to maintain regular sleep-wake cycles while acutely hospitalized. Pt return verbalizes understanding, and therapist praises pt on progress and ongoing support of her infant during admission. RN updated on session. Pt making good progress toward goals. Pt  continues to benefit from skilled OT services to maximize return to PLOF and minimize risk of future falls, injury, caregiver burden, and readmission. Will continue to follow POC. Discharge recommendation for acute inpatient rehabilitation remains most appropriate discharge for this patient.    Follow Up Recommendations  CIR    Equipment Recommendations  3 in 1 bedside commode    Recommendations for Other Services Rehab consult    Precautions / Restrictions Precautions Precautions: Fall Restrictions Weight Bearing Restrictions: No       Mobility Bed Mobility Overal bed mobility: Needs Assistance Bed Mobility: Supine to Sit     Supine to sit: Supervision;HOB elevated Sit to supine: Supervision;HOB elevated   General bed mobility comments: mild increased effort to perform on own  Transfers Overall transfer level: Needs assistance Equipment used: Rolling walker (2 wheeled) Transfers: Sit to/from Stand Sit to Stand: Min assist Stand pivot transfers: Min assist;+2 physical assistance       General transfer comment: Pt able to perform STS x1 from EOB with +1 min assist. She is able to take short steps with heavy reliance on RW toward sink from EOB with +1 MIN-MOD A to maintain balance. Intermittent knee buckling noted during standing tasks this date.    Balance Overall balance assessment: Needs assistance Sitting-balance support: No upper extremity supported;Feet supported Sitting balance-Leahy Scale: Normal     Standing balance support: Bilateral upper extremity supported Standing balance-Leahy Scale: Poor Standing balance comment: pt requiring heavy UE support on RW for static standing balance, requires min-mod A for dynamic standing 2/2 intermittent knee buckling.  ADL either performed or assessed with clinical judgement   ADL Overall ADL's : Needs assistance/impaired                                        General ADL Comments: Pt continues to be significantly functionally limited by decreased sensation, AROM, and functional use of BLE. She requires +2 assist for functional mobility over brief distances using a RW. She continues to require MOD A for LB dressing tasks with assist to thread B feet through pants/clothing. She is able to perform bed-level UB dressing/grooming with set-up/supervision.     Vision Patient Visual Report: No change from baseline     Perception     Praxis      Cognition Arousal/Alertness: Awake/alert Behavior During Therapy: WFL for tasks assessed/performed;Flat affect Overall Cognitive Status: Within Functional Limits for tasks assessed                                          Exercises Other Exercises Other Exercises: OT facilitates static/dynamic sitting and standing activity to support weight bearing through BLE, functional reach, and participation in standing ADL management at sink. Pt/caregiver also education on strategies to maintain regular sleep/wake cycles while in the hospital.   Shoulder Instructions       General Comments      Pertinent Vitals/ Pain       Pain Assessment: No/denies pain  Home Living Family/patient expects to be discharged to:: Private residence                                        Prior Functioning/Environment              Frequency  Min 3X/week        Progress Toward Goals  OT Goals(current goals can now be found in the care plan section)  Progress towards OT goals: Progressing toward goals  Acute Rehab OT Goals Patient Stated Goal: to go home OT Goal Formulation: With patient Time For Goal Achievement: 12/23/20 Potential to Achieve Goals: Good  Plan Discharge plan remains appropriate    Co-evaluation                 AM-PAC OT "6 Clicks" Daily Activity     Outcome Measure   Help from another person eating meals?: A Little Help from another person taking  care of personal grooming?: A Little Help from another person toileting, which includes using toliet, bedpan, or urinal?: A Lot Help from another person bathing (including washing, rinsing, drying)?: A Lot Help from another person to put on and taking off regular upper body clothing?: A Little Help from another person to put on and taking off regular lower body clothing?: A Lot 6 Click Score: 15    End of Session Equipment Utilized During Treatment: Gait belt;Rolling walker  OT Visit Diagnosis: Other abnormalities of gait and mobility (R26.89);Other symptoms and signs involving the nervous system (R29.898)   Activity Tolerance Patient tolerated treatment well   Patient Left in bed;with call bell/phone within reach;with bed alarm set;Other (comment)   Nurse Communication Mobility status        Time: 6010-9323 OT Time Calculation (min): 29 min  Charges: OT  General Charges $OT Visit: 1 Visit OT Treatments $Therapeutic Activity: 23-37 mins  Rockney Ghee, M.S., OTR/L Ascom: [phone] 12/11/20, 1:15 PM

## 2020-12-11 NOTE — Progress Notes (Signed)
Patient assisted x2 RN to [facility] from recliner (taking a couple of steps) then from [facility] to bed (taking a couple more steps). Tolerated well, no c/o pain just "tingles".

## 2020-12-12 LAB — HIGH SENSITIVITY CRP: CRP, High Sensitivity: 14.59 mg/L — ABNORMAL HIGH (ref 0.00–3.00)

## 2020-12-12 LAB — ANA W/REFLEX IF POSITIVE: Anti Nuclear Antibody (ANA): NEGATIVE

## 2020-12-12 LAB — TISSUE TRANSGLUTAMINASE, IGA: Tissue Transglutaminase Ab, IgA: <2 U/mL (ref 0–3)

## 2020-12-12 LAB — HOMOCYSTEINE: Homocysteine: 7.3 umol/L (ref 0.0–14.5)

## 2020-12-12 LAB — ANGIOTENSIN CONVERTING ENZYME: Angiotensin-Converting Enzyme: 25 U/L (ref 14–82)

## 2020-12-12 NOTE — Progress Notes (Signed)
Brief Neurology progress note  Pt is a 25 y.o. female s/p repeat C-section 12/06/20 with spinal anesthesia (level L3/L4) d/t rupture of membranes at 35 weeks and 1 day. Impaired sensation and strength B feet since. No significant findings on CTL imaging.  Results for WALDINE, ZENZ (MRN [id number]) as of 12/12/2020 18:03  Ref. Range 12/10/2020 18:59  Homocysteine Latest Ref Range: 0.0 - 14.5 umol/L 7.3    Ref. Range 12/07/2020 13:44  Methylmalonic Acid, Quantitative Latest Ref Range: 0 - 378 nmol/L 178  Vitamin B12 Latest Ref Range: 180 - 914 pg/mL 120 (L)    Ref. Range 12/10/2020 18:59  Homocysteine Latest Ref Range: 0.0 - 14.5 umol/L 7.3    Ref. Range 12/10/2020 18:59  Anti Nuclear Antibody (ANA) Latest Ref Range: Negative  Negative    Nursing staff reported walking into patient's to administer meds and observed the patient ambulating with the walker in the room by herself. She said she was able to place her feet for the steps but could not actually feel her feet, that they felt like she was "walking on air".   Labs thus far are WNL.  The fluctuating exam findings, continued improvement and now ambulating with walker unassisted   Continue empric B12 1000 mcg which may be switched to PO daily. Lab results are still pending and several will take a month to result. The associated diseases are not emergent/life threatening. Lab results and further evaluation may be continued as outpatient.  The patient would derive the most benefit from inpatient rehabilitation. No further neurology recommendations at this time. Please call for questions.   Marisue Humble, MD Page: [phone]

## 2020-12-12 NOTE — Progress Notes (Signed)
Upon entering patient room to administer medication, this RN found patient to be ambulating in room independently with walker.  Patient reeducated on the dangers of ambulating without assistance from staff and encouraged patient to call staff to ambulate in the future.  Patient stated that she was able to take the steps independently but was unable to feel her feet or that she felt as if her feet were "walking on air".  After reinforcing the dangers of ambulating on her own, patient agreed to always call staff if she need to use the bathroom or if she would like to ambulate in the hallway.

## 2020-12-12 NOTE — Progress Notes (Signed)
Patient assisted x 2 RN & NT to [facility] from bed (taking a couple of steps & pivoting) then from [facility] to bed (taking a couple more steps & pivoting). Tolerated well, no complaints.

## 2020-12-12 NOTE — Progress Notes (Signed)
Pt bed alarm went off at this time. Setting was on the lowest setting so was not to alarm unless pt got completely out of bed. When went into room pt was sitting on side of bed washing bottle in the sink. Bed alarm reinstated and pt needs addressed. No needs or concerns expressed

## 2020-12-12 NOTE — Progress Notes (Signed)
Post Partum Day 6 Subjective: Doing well, no complaints.  Tolerating regular diet, pain with PO meds, voiding without difficulty. Continues to have neuro issues, being followed by Neuro and TOC. Seeing PT/OT.   No CP SOB Fever,Chills, N/V or leg pain; denies nipple or breast pain, no HA change of vision, RUQ/epigastric pain  Objective: BP 107/72 (BP Location: Right Arm)   Pulse 62   Temp 98.5 F (36.9 C) (Oral)   Resp 18   Ht 5\' 9"  (1.753 m)   Wt 123.4 kg   LMP 04/04/2020   SpO2 97%   Breastfeeding Unknown   BMI 40.17 kg/m    Physical Exam:  General: NAD Breasts: soft/nontender, pumping for infant.  CV: RRR Pulm: nl effort, CTABL Abdomen: soft, NT, BS x 4 Incision: Honeycomb Dsg CDI, no erythema or drainage Lochia: scant Uterine Fundus: fundus firm and 2 fb below umbilicus DVT Evaluation: no cords, ttp LEs   Recent Labs    12/10/20 0254  HGB 8.7*  HCT 26.9*  WBC 12.6*  PLT 310    Assessment/Plan: 24 y.o. 02/07/21 postpartum day # 6  - Continue routine PP care- ready for DC from OB standpoint.  - Neuro and TOC managing care related to paresthesia issues. Continues with PT/OT, Orthotics ordered today to help with stability and foot drop.  - Lactation consult prn - Immunization status: Needs varicella prior to DC  Disposition: Does not desire Dc home today.    [id number], CNM 12/12/2020  10:07 AM

## 2020-12-12 NOTE — Progress Notes (Signed)
Occupational Therapy Treatment Patient Details Name: Robyn Dunn MRN: [id number] DOB: [birth year] Today's Date: 12/12/2020    History of present illness Pt is a 25 y.o. female s/p repeat C-section 12/06/20 with spinal anesthesia (level L3/L4) d/t rupture of membranes at 35 weeks and 1 day.  Impaired sensation and strength B feet since.  No significant findings on CTL imaging.   OT comments  Pt seen for OT treatment on this date. Hospital in-person interpreter services utilized t/o session. Upon arrival, pt awake/alert, she denies pain, and is eager to participate in therapy session. Pt seated EOB, feeding infant. She continues to require set-up/supervision for seated ADL/IADL management. She is able to perform functional mobility with MIN A STS using a RW this date. +2 assist provided for safety/chair follow 2/2 limited activity tolerance and increased fatigue with functional mobility. Pt limited with standing balance 2/2 decreased annkle/knee stability. She continues to hyper extend B knees upon initial come to stand and requires BUE support on RW during functional mobility. Pt endorses pain in B calves upon standing and PROM into neutral dorsiflexion. Pt educated on importance of ongoing weight bearing through BLE as well as passive stretch to maximize ankle ROM and minimize foot drop. Pt return verbalizes understanding. Pt participates well in therapy session. She is able to ambulate ~60 feet with +2 assist and use or RW. RN updated on session. Pt making good progress toward goals. Pt continues to benefit from skilled OT services to maximize return to PLOF and minimize risk of future falls, injury, caregiver burden, and readmission. Will continue to follow POC. Discharge recommendation for acute inpatient rehabilitation remains most appropriate discharge for this patient  Follow Up Recommendations  CIR    Equipment Recommendations  3 in 1 bedside commode    Recommendations for Other  Services Rehab consult    Precautions / Restrictions Precautions Precautions: Fall Restrictions Weight Bearing Restrictions: No       Mobility Bed Mobility Overal bed mobility: Needs Assistance Bed Mobility: Supine to Sit     Supine to sit: HOB elevated;Supervision Sit to supine: Supervision;HOB elevated      Transfers Overall transfer level: Needs assistance Equipment used: Rolling walker (2 wheeled) Transfers: Sit to/from Stand Sit to Stand: Min assist Stand pivot transfers: Min assist;+2 physical assistance       General transfer comment: Min assist for safety to STS EOB to RW. BLE weakness noted. blocked knees for safety due to high risk of buckling.    Balance Overall balance assessment: Needs assistance Sitting-balance support: No upper extremity supported;Feet supported Sitting balance-Leahy Scale: Normal Sitting balance - Comments: steady sitting reaching outside BOS   Standing balance support: Bilateral upper extremity supported Standing balance-Leahy Scale: Fair Standing balance comment: High risk of falls 2/2 to bilateral LE weakness and instability; requires BUE support on RW during standing tasks.                           ADL either performed or assessed with clinical judgement   ADL Overall ADL's : Needs assistance/impaired                                       General ADL Comments: Pt seated, feeding infant upon arrival to room. Continues to require set-up/supervision for seated ADL/IADL management. She is able to perform functional mobility with MIN A STS using  a RW this date. +2 assist provided for safety/chair follow. Pt limited with standing balance 2/2 decreased annkle/knee stability in standing. She continues to hyper extend upon initial come to stand and requires BUE support on RW during functional mobility.     Vision Patient Visual Report: No change from baseline     Perception     Praxis      Cognition  Arousal/Alertness: Awake/alert Behavior During Therapy: WFL for tasks assessed/performed;Flat affect Overall Cognitive Status: Within Functional Limits for tasks assessed                                 General Comments: spanish interpreter used throughout session.        Exercises Other Exercises Other Exercises: OT/PT facilitate safe functional transfer/mobility. Pt educated on importance of ongoing mobility training and passive stretch through BLE. She is noted to demonstrate min increased tone/decreased flexiblity with passive dorsiflexion and c/o of pain in both legs at the gastroc with passive stretch. Educated on importance of maintining full ROM in B legs to support ongoing progress with mobility and functional independence upon DC.   Shoulder Instructions       General Comments      Pertinent Vitals/ Pain       Pain Assessment: No/denies pain Pain Score: 0-No pain Faces Pain Scale: Hurts a little bit Pain Location: Back of legs (calf area) with passive stretch and/or standing. Pain Descriptors / Indicators: Sore Pain Intervention(s): Limited activity within patient's tolerance;Monitored during session;Repositioned;Utilized relaxation techniques  Home Living                                          Prior Functioning/Environment              Frequency  Min 3X/week        Progress Toward Goals  OT Goals(current goals can now be found in the care plan section)  Progress towards OT goals: Progressing toward goals  Acute Rehab OT Goals Patient Stated Goal: go home after rehab here OT Goal Formulation: With patient Time For Goal Achievement: 12/23/20 Potential to Achieve Goals: Good  Plan Discharge plan remains appropriate    Co-evaluation    PT/OT/SLP Co-Evaluation/Treatment: Yes Reason for Co-Treatment: Complexity of the patient's impairments (multi-system involvement);For patient/therapist safety;To address functional/ADL  transfers PT goals addressed during session: Mobility/safety with mobility;Balance;Proper use of DME;Strengthening/ROM OT goals addressed during session: ADL's and self-care;Proper use of Adaptive equipment and DME      AM-PAC OT "6 Clicks" Daily Activity     Outcome Measure   Help from another person eating meals?: A Little Help from another person taking care of personal grooming?: A Little Help from another person toileting, which includes using toliet, bedpan, or urinal?: A Little Help from another person bathing (including washing, rinsing, drying)?: A Lot Help from another person to put on and taking off regular upper body clothing?: A Little Help from another person to put on and taking off regular lower body clothing?: A Lot 6 Click Score: 16    End of Session Equipment Utilized During Treatment: Gait belt;Rolling walker  OT Visit Diagnosis: Other abnormalities of gait and mobility (R26.89);Other symptoms and signs involving the nervous system (R29.898)   Activity Tolerance Patient tolerated treatment well   Patient Left in bed;with call bell/phone within reach;Other (  comment);with bed alarm set   Nurse Communication Mobility status        Time: 8115-7262 OT Time Calculation (min): 28 min  Charges: OT General Charges $OT Visit: 1 Visit OT Treatments $Therapeutic Activity: 8-22 mins  Rockney Ghee, M.S., OTR/L Ascom: [phone] 12/12/20, 12:49 PM

## 2020-12-12 NOTE — Progress Notes (Signed)
Physical Therapy Treatment Patient Details Name: Robyn Dunn MRN: [id number] DOB: [birth year] Today's Date: 12/12/2020    History of Present Illness Pt is a 25 y.o. female s/p repeat C-section 12/06/20 with spinal anesthesia (level L3/L4) d/t rupture of membranes at 35 weeks and 1 day.  Impaired sensation and strength B feet since.  No significant findings on CTL imaging.    PT Comments    PT/OT co-treat for safety and complexity of session. Spanish interpreter present throughout session. Pt is A and O x 4 with flat affect. Visibly frustrated with situation however very pleasant and motivated. Continues to have inability to activitely dorsiflex bilateral LEs. Will greatly benefit from AFOs for improve gait kinematics while improving stability of knee to prevent hyper-extension. MD placed order for AFOs. Author contacted orthotist. Will acquire shoes for braces since pt does not have any here. Pt is great CIR candidate and will greatly benefit from continued skilled therapy to address deficits while assisting pt to PLOF. Acute PT will continue to follow and progress as able per POC.    Follow Up Recommendations  CIR      Equipment Recommendations  Rolling walker with 5" wheels;3in1 (PT);Wheelchair (measurements PT);Wheelchair cushion (measurements PT);Other (comment) (W/C if pt DC home prior to going to rehab.)    Recommendations for Other Services       Precautions / Restrictions Precautions Precautions: Fall Restrictions Weight Bearing Restrictions: Yes    Mobility  Bed Mobility Overal bed mobility: Needs Assistance Bed Mobility: Supine to Sit     Supine to sit: Supervision;HOB elevated        Transfers Overall transfer level: Needs assistance Equipment used: Rolling walker (2 wheeled) Transfers: Sit to/from Stand Sit to Stand: Min assist         General transfer comment: Min assist for safety to STS EOB to RW. BLE weakness noted. blocked knees for  safety due to high risk of buckling.  Ambulation/Gait Ambulation/Gait assistance: Min guard;Min assist;+2 safety/equipment (chair follow for safety) Gait Distance (Feet): 60 Feet Assistive device: Rolling walker (2 wheeled) Gait Pattern/deviations: Step-to pattern;Step-through pattern Gait velocity: decreased   General Gait Details: pt was able to advance gait distances today without seated rest. Pt continues to have Bilateral foot drop and slight hyper extension of knessduring stance phase. requested order for bilateral AFOs to improve gait safety. MD ordered and Chartered loss adjuster contacted orthotist.       Balance Overall balance assessment: Needs assistance Sitting-balance support: No upper extremity supported;Feet supported Sitting balance-Leahy Scale: Normal Sitting balance - Comments: steady sitting reaching outside BOS   Standing balance support: Bilateral upper extremity supported Standing balance-Leahy Scale: Fair Standing balance comment: High risk of falls 2/2 to bilateral LE weakness and instability.         Cognition Arousal/Alertness: Awake/alert Behavior During Therapy: WFL for tasks assessed/performed;Flat affect Overall Cognitive Status: Within Functional Limits for tasks assessed      General Comments: spanish interpreter used throughout session.             Pertinent Vitals/Pain Pain Assessment: No/denies pain Pain Score: 0-No pain Faces Pain Scale: No hurt Pain Location: ankles with WB especially with SLS activities Pain Descriptors / Indicators: Sore Pain Intervention(s): Limited activity within patient's tolerance;Monitored during session;Premedicated before session;Repositioned           PT Goals (current goals can now be found in the care plan section) Acute Rehab PT Goals Patient Stated Goal: go home after rehab here Progress towards PT goals:  Progressing toward goals    Frequency    7X/week      PT Plan Current plan remains appropriate     Co-evaluation   Reason for Co-Treatment: Complexity of the patient's impairments (multi-system involvement);For patient/therapist safety;To address functional/ADL transfers;Necessary to address cognition/behavior during functional activity PT goals addressed during session: Mobility/safety with mobility;Balance;Proper use of DME;Strengthening/ROM        AM-PAC PT "6 Clicks" Mobility   Outcome Measure  Help needed turning from your back to your side while in a flat bed without using bedrails?: None Help needed moving from lying on your back to sitting on the side of a flat bed without using bedrails?: None Help needed moving to and from a bed to a chair (including a wheelchair)?: A Little Help needed standing up from a chair using your arms (e.g., wheelchair or bedside chair)?: A Little Help needed to walk in hospital room?: A Little Help needed climbing 3-5 steps with a railing? : Total 6 Click Score: 18    End of Session Equipment Utilized During Treatment: Gait belt Activity Tolerance: Patient tolerated treatment well Patient left: in bed;with call bell/phone within reach Nurse Communication: Mobility status;Precautions PT Visit Diagnosis: Other abnormalities of gait and mobility (R26.89);Difficulty in walking, not elsewhere classified (R26.2);Other symptoms and signs involving the nervous system (R29.898)     Time: 8768-1157 PT Time Calculation (min) (ACUTE ONLY): 30 min  Charges:  $Gait Training: 8-22 mins                     Jetta Lout PTA 12/12/20, 11:30 AM

## 2020-12-13 LAB — CBC
HCT: 29.6 % — ABNORMAL LOW (ref 36.0–46.0)
Hemoglobin: 9.7 g/dL — ABNORMAL LOW (ref 12.0–15.0)
MCH: 30.4 pg (ref 26.0–34.0)
MCHC: 32.8 g/dL (ref 30.0–36.0)
MCV: 92.8 fL (ref 80.0–100.0)
Platelets: 324 10*3/uL (ref 150–400)
RBC: 3.19 MIL/uL — ABNORMAL LOW (ref 3.87–5.11)
RDW: 12.9 % (ref 11.5–15.5)
WBC: 7.4 10*3/uL (ref 4.0–10.5)
nRBC: 0 % (ref 0.0–0.2)

## 2020-12-13 NOTE — Progress Notes (Signed)
Post Partum Day 7 Subjective: Doing well, no complaints.  Tolerating regular diet, pain with PO meds, voiding without difficulty. Continues to have neuro issues, being followed by Neuro and TOC. Seeing PT/OT.  - has been observed by nursing staff and able to ambulate with her walker in room   No CP SOB Fever,Chills, N/V or leg pain; denies nipple or breast pain, no HA change of vision, RUQ/epigastric pain  Objective: BP 107/61 (BP Location: Right Arm)   Pulse 61   Temp 97.7 F (36.5 C) (Oral)   Resp 18   Ht 5\' 9"  (1.753 m)   Wt 123.4 kg   LMP 04/04/2020   SpO2 99%   Breastfeeding Unknown   BMI 40.17 kg/m    Physical Exam:  General: NAD Breasts: soft/nontender, pumping for infant.  CV: RRR Pulm: nl effort, CTABL Abdomen: soft, NT, BS x 4 Incision: Honeycomb Dsg CDI, no erythema or drainage Lochia: scant Uterine Fundus: fundus firm and 2 fb below umbilicus DVT Evaluation: no cords, ttp LEs   Recent Labs    12/13/20 0625  HGB 9.7*  HCT 29.6*  WBC 7.4  PLT 324    Assessment/Plan: 25 y.o. 25 postpartum day # 6  - Continue routine PP care- ready for DC from OB standpoint. Remove honeycomb dressing today. Place knee high TED hose for VTE prophy.  - Neuro and TOC managing care related to paresthesia issues. Continues with PT/OT, Orthotics ordered today to help with stability and foot drop.  - Lactation consult prn - Immunization status: Needs varicella prior to DC  Disposition: Does desire Dc home today.    [id number], CNM 12/13/2020  9:41 AM

## 2020-12-13 NOTE — Progress Notes (Signed)
Physical Therapy Treatment Patient Details Name: Robyn Dunn MRN: [id number] DOB: [birth year] Today's Date: 12/13/2020    History of Present Illness Pt is a 25 y.o. female s/p repeat C-section 12/06/20 with spinal anesthesia (level L3/L4) d/t rupture of membranes at 35 weeks and 1 day.  Impaired sensation and strength B feet since.  No significant findings on CTL imaging.    PT Comments    Pt ready for session.  TED hose donned per RN request, B feet noted to be sore with attempts and care given.  She is able to stand with min a x 1 and progress gait around unit x 1 with heavy reliance on RW with BUE's.  +2 assist for safety and to manage interpreter on stick.  She has a cautious step through gait patterns with 1 episode of LE's getting significantly crossed while turning but is able to maintain her balance with UE support. Fatigue is evident but she continues to push herself back into room despite decreasing gait quality and increasing fall risk.  Exercises including standing BLE ex x 10, mini squats, sit to stands and transfers to from bed <-> chair to increase strength and balance.  Discussed at length discharge plan.  Pt stated she hoped to go home today.  Affect is a bit flat today and she seems generally discouraged.  Encouragement provided.  AFO's were initiated yesterday and shoes were obtained and in room. Orthotist stated AFO's would be here Monday and at latest on Tuesday.  Pt is encouraged to say at least until AFO's are here as they will support her B ankles and improve overall stability, balance and decrease risk of injury.  Given pt's lack of insurance, it may be difficult for her to obtain them once discharged from facility if she chooses to go home.    Pt does state that she feels her legs are weaker today below her knees.  Stated she felt like it was more difficult to stand today compared to yesterday.    Discussed with RN regarding her c/o feeling somewhat weaker  today.  It pt does discharge home, she will need RW, Wheelchair, and 3-in-1 commode along with +1 assist for safety.  She will need to be wheelchair level for safe care of her infant and is unsafe to walk with baby in her arms.    Follow Up Recommendations  CIR     Equipment Recommendations  Rolling walker with 5" wheels;3in1 (PT);Wheelchair (measurements PT);Wheelchair cushion (measurements PT);Other (comment)    Recommendations for Other Services       Precautions / Restrictions Precautions Precautions: Fall Restrictions Weight Bearing Restrictions: No    Mobility  Bed Mobility Overal bed mobility: Modified Independent Bed Mobility: Supine to Sit;Sit to Supine     Supine to sit: Modified independent (Device/Increase time) Sit to supine: Modified independent (Device/Increase time)      Transfers Overall transfer level: Needs assistance Equipment used: Rolling walker (2 wheeled) Transfers: Sit to/from Stand Sit to Stand: Min assist;Min guard Stand pivot transfers: Min guard       General transfer comment: falls back onto bed x 1 on first attempt to stand  Ambulation/Gait Ambulation/Gait assistance: Min guard;Min assist;+2 safety/equipment Gait Distance (Feet): 140 Feet Assistive device: Rolling walker (2 wheeled) Gait Pattern/deviations: Step-to pattern;Step-through pattern Gait velocity: decreased   General Gait Details: able to complete lap around maternity nursing pod.  decreased gait quality with fatigue   Stairs  Wheelchair Mobility    Modified Rankin (Stroke Patients Only)       Balance Overall balance assessment: Needs assistance Sitting-balance support: No upper extremity supported;Feet supported Sitting balance-Leahy Scale: Normal     Standing balance support: Bilateral upper extremity supported Standing balance-Leahy Scale: Fair                              Cognition Arousal/Alertness: Awake/alert Behavior  During Therapy: WFL for tasks assessed/performed;Flat affect Overall Cognitive Status: Within Functional Limits for tasks assessed                                 General Comments: spanish interpreter used throughout session.  Einar Grad [phone]      Exercises      General Comments        Pertinent Vitals/Pain Pain Assessment: Faces Faces Pain Scale: Hurts little more Pain Location: B feet with donning TED hose Pain Descriptors / Indicators: Sore Pain Intervention(s): Limited activity within patient's tolerance;Monitored during session    Home Living                      Prior Function            PT Goals (current goals can now be found in the care plan section) Progress towards PT goals: Progressing toward goals    Frequency    7X/week      PT Plan Current plan remains appropriate    Co-evaluation              AM-PAC PT "6 Clicks" Mobility   Outcome Measure  Help needed turning from your back to your side while in a flat bed without using bedrails?: None Help needed moving from lying on your back to sitting on the side of a flat bed without using bedrails?: None Help needed moving to and from a bed to a chair (including a wheelchair)?: A Little Help needed standing up from a chair using your arms (e.g., wheelchair or bedside chair)?: A Little Help needed to walk in hospital room?: A Little Help needed climbing 3-5 steps with a railing? : Total 6 Click Score: 18    End of Session Equipment Utilized During Treatment: Gait belt Activity Tolerance: Patient tolerated treatment well Patient left: in bed;with call bell/phone within reach;with family/visitor present Nurse Communication: Mobility status;Precautions       Time: 7408-1448 PT Time Calculation (min) (ACUTE ONLY): 38 min  Charges:  $Gait Training: 8-22 mins $Therapeutic Exercise: 8-22 mins $Therapeutic Activity: 8-22 mins                    Danielle Dess, PTA 12/13/20,  11:29 AM

## 2020-12-13 NOTE — Progress Notes (Signed)
Pt was able to make full lap around nurses station with PT today. Is able to ambulate with 1 assist and gait belt, pt completes all ADL's with little to no intervention or assistance. Still has some trouble with foot placement when walking, moves very purposefully to plant her feet. Awaiting orthotic braces for D/C, per PT they should be here tomorrow (see Nate).

## 2020-12-13 NOTE — Lactation Note (Signed)
Lactation Consultation Note  Patient Name: Robyn Dunn Today's Date: 12/13/2020   Age:25 y.o.  Maternal Data  Mom had a repeat C/S 12/06/20 and since then has had impaired sensation and strength in both feed.  Robyn Dunn has been discharged.  Mom is still pumping for Robyn Dunn who is in the room with mom. Encouraged mom to put her to the breast when she demonstrated feeding cues.  She pumped 50 ml.  If poor or no breast feed, encouraged mom to pump to ensure a plentiful milk supply and prevent engorgement.  Lactation name and number has been written on white board and encouraged to call with any questions, concerns or assistance.    Feeding    LATCH Score                   Interventions    Lactation Tools Discussed/Used     Consult Status      Louis Meckel 12/13/2020, 3:42 PM

## 2020-12-14 LAB — IMMUNOFIXATION ELECTROPHORESIS
IgA: 277 mg/dL (ref 87–352)
IgG (Immunoglobin G), Serum: 978 mg/dL (ref 586–1602)
IgM (Immunoglobulin M), Srm: 162 mg/dL (ref 26–217)
Total Protein ELP: 6.8 g/dL (ref 6.0–8.5)

## 2020-12-14 LAB — PROTEIN ELECTROPHORESIS, SERUM
A/G Ratio: 0.7 (ref 0.7–1.7)
Albumin ELP: 2.7 g/dL — ABNORMAL LOW (ref 2.9–4.4)
Alpha-1-Globulin: 0.4 g/dL (ref 0.0–0.4)
Alpha-2-Globulin: 1.1 g/dL — ABNORMAL HIGH (ref 0.4–1.0)
Beta Globulin: 1.3 g/dL (ref 0.7–1.3)
Gamma Globulin: 1 g/dL (ref 0.4–1.8)
Globulin, Total: 3.7 g/dL (ref 2.2–3.9)
Total Protein ELP: 6.4 g/dL (ref 6.0–8.5)

## 2020-12-14 LAB — B. BURGDORFI ANTIBODIES: B burgdorferi Ab IgG+IgM: <0.91 {ISR} (ref 0.00–0.90)

## 2020-12-14 MED ORDER — ACETAMINOPHEN 500 MG PO TABS: 1000.0000 mg | ORAL_TABLET | Freq: Four times a day (QID) | ORAL | 0 refills | Status: AC

## 2020-12-14 MED ORDER — VARICELLA VIRUS VACCINE LIVE 1350 PFU/0.5ML IJ SUSR
0.5000 mL | INTRAMUSCULAR | Status: AC | PRN
  Administered 2020-12-14: 0.5 mL via SUBCUTANEOUS
  Filled 2020-12-14 (×2): qty 0.5

## 2020-12-14 MED ORDER — GABAPENTIN 300 MG PO CAPS: 300.0000 mg | ORAL_CAPSULE | Freq: Every day | ORAL | 2 refills | Status: AC

## 2020-12-14 MED ORDER — OXYCODONE HCL 5 MG PO TABS: 5.0000 mg | ORAL_TABLET | ORAL | 0 refills | Status: DC | PRN

## 2020-12-14 MED ORDER — IBUPROFEN 800 MG PO TABS: 800.0000 mg | ORAL_TABLET | Freq: Four times a day (QID) | ORAL | 0 refills | Status: AC

## 2020-12-14 NOTE — Progress Notes (Signed)
Robyn Dunn delivered braces to pt. Robyn Dunn with PT notified that pt has braces and is now in room with pt working with her with her braces.

## 2020-12-14 NOTE — TOC Progression Note (Signed)
Transition of Care [facility]) - Progression Note    Patient Details  Name: Robyn Dunn MRN: [id number] Date of Birth: [birth year]  Transition of Care [facility] CM/SW Contact  Marina Goodell Phone Number: [phone] 12/14/2020, 2:52 PM  Clinical Narrative:     CSW spoke with patient about discharge plans. CSW asked patient if she would be able to stay with her niece here in [place].  Patient stated she was not certain she would be able to stay with her niece due to the small size of the house.  CSW requested the patient ask her niece if she could stay with her for a couple of weeks until her husband can return from [place] to get pick her up.  Patient wanted to know if she was going to CIR.  CSW stated it was my understanding there was not a bed available at CIR, and there would not be one available for another week or so.  CSW stated we could assist the patient with returning to [place] via bus but it was a lon drive and she would not have anyone to assist her with the children.  Patient verbalized understanding.  CSW encouraged patient to speak with her niece and get a confirmation about whether she could stay at her house when her niece came to see her today.  Patient stated she would ask her niece.      Expected Discharge Plan and Services                                                 Social Determinants of Health (SDOH) Interventions    Readmission Risk Interventions No flowsheet data found.

## 2020-12-14 NOTE — Progress Notes (Signed)
Walker brought to pt, pt now awaiting wheelchair to be delivered around 10pm. Dr. Dalbert Garnet notified of pt wanting to d/c with sister to [facility]. Request of information form for records to be sent to [facility] signed and in chart. Dr. Dalbert Garnet states she will come to floor to see pt and d/c pt.

## 2020-12-14 NOTE — Progress Notes (Signed)
Patient discharged. Discharge instructions, prescriptions, and follow up appointments given to and reviewed with patient. Patient verbalized understanding. Escorted out by Public relations account executive.

## 2020-12-14 NOTE — TOC Progression Note (Signed)
Transition of Care [facility]) - Progression Note    Patient Details  Name: Robyn Dunn MRN: [id number] Date of Birth: 1996/09/29  Transition of Care [facility]) CM/SW Contact  [place] Cellar, RN Phone Number: 12/14/2020, 5:15 PM  Clinical Narrative:    Patient reports she is discharging back home to [place] today with her family. RN CM provided patient with charity walker but was unable to provide wheelchair due to patient going [place] of state.         Expected Discharge Plan and Services                                                 Social Determinants of Health (SDOH) Interventions    Readmission Risk Interventions No flowsheet data found.

## 2020-12-14 NOTE — Progress Notes (Signed)
Physical Therapy Treatment Patient Details Name: Robyn Dunn MRN: [id number] DOB: 1996/01/24 Today's Date: 12/14/2020    History of Present Illness Pt is a 25 y.o. female s/p repeat C-section 12/06/20 with spinal anesthesia (level L3/L4) d/t rupture of membranes at 35 weeks and 1 day.  Impaired sensation and strength B feet since.  No significant findings on CTL imaging.    PT Comments    Braces delivered this am.  Donned. R brace fits well but L hits on Lateral malleolus and unable to wear due to pain and pressure on bony prominence.  She is able to stand and walk 75' with RW and close min guard.  Limited by writer due to fatigue and decreasing gait quality.  She reports increasing pain R ankle and overall increasing weakness which she stated continues to feel as if it is worsening daily.  Pt continues  with little to no AROM at B ankles.  [employer] has ordered a replacement brace for L ankle but will not arrive until Wednesday.  Pt and RN aware.  Discussed case with Rehab Supervisor given complexity of case.  She anticipates being able to see pt after lunch today.    Pt remains CIR appropriate if available to her.  If pt is unable or discharge to CIR, she will need all equipment below to make discharge as safe as possible.   Follow Up Recommendations  CIR     Equipment Recommendations  Rolling walker with 5" wheels;3in1 (PT);Wheelchair (measurements PT);Wheelchair cushion (measurements PT);Other (comment)    Recommendations for Other Services       Precautions / Restrictions Precautions Precautions: Fall Restrictions Weight Bearing Restrictions: No    Mobility  Bed Mobility   Bed Mobility: Supine to Sit;Sit to Supine     Supine to sit: Modified independent (Device/Increase time) Sit to supine: Modified independent (Device/Increase time)      Transfers Overall transfer level: Needs assistance Equipment used: Rolling walker (2 wheeled) Transfers: Sit  to/from Stand Sit to Stand: Min assist;Min guard            Ambulation/Gait Ambulation/Gait assistance: Min guard;Min assist;+2 safety/equipment Gait Distance (Feet): 70 Feet Assistive device: Rolling walker (2 wheeled) Gait Pattern/deviations: Step-to pattern;Step-through pattern Gait velocity: decreased   General Gait Details: limited gait by Clinical research associate due to fatigue   Stairs             Wheelchair Mobility    Modified Rankin (Stroke Patients Only)       Balance Overall balance assessment: Needs assistance Sitting-balance support: No upper extremity supported;Feet supported Sitting balance-Leahy Scale: Normal     Standing balance support: Bilateral upper extremity supported Standing balance-Leahy Scale: Fair                              Cognition Arousal/Alertness: Awake/alert Behavior During Therapy: WFL for tasks assessed/performed;Flat affect Overall Cognitive Status: Within Functional Limits for tasks assessed                                        Exercises      General Comments General comments (skin integrity, edema, etc.): Very heavy reliance on walker for support and balance      Pertinent Vitals/Pain Pain Assessment: Faces Faces Pain Scale: Hurts even more Pain Location: B feet with donning AFO Pain Descriptors / Indicators: Sore Pain  Intervention(s): Limited activity within patient's tolerance;Monitored during session;Premedicated before session    Home Living                      Prior Function            PT Goals (current goals can now be found in the care plan section) Progress towards PT goals: Progressing toward goals    Frequency    7X/week      PT Plan Current plan remains appropriate    Co-evaluation              AM-PAC PT "6 Clicks" Mobility   Outcome Measure  Help needed turning from your back to your side while in a flat bed without using bedrails?: None Help needed  moving from lying on your back to sitting on the side of a flat bed without using bedrails?: None Help needed moving to and from a bed to a chair (including a wheelchair)?: A Little Help needed standing up from a chair using your arms (e.g., wheelchair or bedside chair)?: A Little Help needed to walk in hospital room?: A Little Help needed climbing 3-5 steps with a railing? : Total 6 Click Score: 18    End of Session Equipment Utilized During Treatment: Gait belt Activity Tolerance: Patient tolerated treatment well Patient left: in bed;with call bell/phone within reach;with family/visitor present Nurse Communication: Mobility status;Precautions       Time: 1001-1041 PT Time Calculation (min) (ACUTE ONLY): 40 min  Charges:  $Gait Training: 8-22 mins $Therapeutic Activity: 23-37 mins                    Danielle Dess, PTA 12/14/20, 11:06 AM

## 2020-12-14 NOTE — Progress Notes (Signed)
Physical Therapy Treatment Patient Details Name: Robyn Dunn MRN: [id number] DOB: [birth year] Today's Date: 12/14/2020    History of Present Illness Pt is a 25 y.o. female s/p repeat C-section 12/06/20 with spinal anesthesia (level L3/L4) d/t rupture of membranes at 35 weeks and 1 day.  Impaired sensation and strength B feet since.  No significant findings on CTL imaging.    PT Comments    Continues to be a great candidate for CIR. I see very minimal improvement in her physical exam (remains with 0/5 strength bilat ankles, exception of very trace movement R toe extension, R DF/eversion; bilat knee extension 4/5, bilat knee flexion 3/5, bilat knee flexion 3+/5). Drastic steppage gait pattern, even with bilat AFOs in place. She is developing compensatory strategies to facilitate improvement in overall mobility, but is very heavily dependant on UE support. Very high fall risk and certainly unable to hold/care for the baby in a standing position. She did tolerate increased gait distance yesterday, though feels somewhat weaker and less steady today in response.  Denies difficulty with UEs, trunk, vision, swallowing or bowel/bladder.  Would benefit from continued neuro work up to define etiology.  Patient with many questions about situation.     Follow Up Recommendations  CIR     Equipment Recommendations  Rolling walker with 5" wheels;3in1 (PT);Wheelchair (measurements PT);Wheelchair cushion (measurements PT);Other (comment)    Recommendations for Other Services       Precautions / Restrictions Precautions Precautions: Fall Restrictions Weight Bearing Restrictions: No    Mobility  Bed Mobility Overal bed mobility: Modified Independent Bed Mobility: Supine to Sit;Sit to Supine           General bed mobility comments: self-assists bilat LEs off bed; heavy use of momentum to elevated back onto bed  Transfers Overall transfer level: Needs assistance Equipment used:  Rolling walker (2 wheeled) Transfers: Sit to/from Stand Sit to Stand: Min assist         General transfer comment: very heavy use of bilat UEs to complete; lacks adequate strength/power bilat quads/hamds to complete  Ambulation/Gait Ambulation/Gait assistance: Min assist Gait Distance (Feet): 30 Feet Assistive device: Rolling walker (2 wheeled)       General Gait Details: forward trunk flexion with very heavy WBing bilat UEs; steppage gait (improved with bilat AFOs), but inconsistent step height/length and overall foot placement. Poor dynamic balance reactions.  Limited knee control/stability with intermittent buckling R > L   Stairs             Wheelchair Mobility    Modified Rankin (Stroke Patients Only)       Balance Overall balance assessment: Needs assistance Sitting-balance support: No upper extremity supported;Feet supported Sitting balance-Leahy Scale: Good     Standing balance support: Bilateral upper extremity supported Standing balance-Leahy Scale: Poor                              Cognition Arousal/Alertness: Awake/alert Behavior During Therapy: WFL for tasks assessed/performed Overall Cognitive Status: Within Functional Limits for tasks assessed                                        Exercises Other Exercises Other Exercises: Very trace movement to R toe extensors, R ankle DF/eversion; otherwise, bilat ankle strength 0/5.  Does endorse numbnesss to L foot (distal shin to toes) and  generalized paresthesia throughout R foot.  Bilat quads 4/5, hams 3/5, hip flexors 3+/5.  No focal assymmetry noted.  Bilat UE grossly 4+/5 throughout; denies numbness/paresthesia, no coordination deficits appreciated. Other Exercises: Sit/stand from edge of bed x4 with RW, min assist; heavy use of UEs. Other Exercises: Sit/stand from edge of bed without RW, mod assist; poor LE strength/power, increased LE genu recurvatum, bracing posterior  surface of LEs against edge of bed for additinoal stability.  Increased episodes of R LE buckling in unsupported standing. Other Exercises: Static standing balance, min/mod assist-unable to withstand even mild external perturbation, any direction; physical assist required to recover balance/prevent fall.    General Comments        Pertinent Vitals/Pain Pain Assessment: Faces Faces Pain Scale: Hurts even more Pain Location: L lateral mallelous Pain Descriptors / Indicators: Aching;Throbbing Pain Intervention(s): Limited activity within patient's tolerance;Monitored during session;Repositioned    Home Living                      Prior Function            PT Goals (current goals can now be found in the care plan section) Acute Rehab PT Goals Patient Stated Goal: go home after rehab here PT Goal Formulation: With patient Time For Goal Achievement: 12/22/20 Potential to Achieve Goals: Good Progress towards PT goals: Progressing toward goals    Frequency    7X/week      PT Plan Current plan remains appropriate    Co-evaluation              AM-PAC PT "6 Clicks" Mobility   Outcome Measure  Help needed turning from your back to your side while in a flat bed without using bedrails?: None Help needed moving from lying on your back to sitting on the side of a flat bed without using bedrails?: None Help needed moving to and from a bed to a chair (including a wheelchair)?: A Little Help needed standing up from a chair using your arms (e.g., wheelchair or bedside chair)?: A Little Help needed to walk in hospital room?: A Little Help needed climbing 3-5 steps with a railing? : Total 6 Click Score: 18    End of Session   Activity Tolerance: Patient tolerated treatment well Patient left: in bed;with call bell/phone within reach;with bed alarm set Nurse Communication: Mobility status PT Visit Diagnosis: Other abnormalities of gait and mobility (R26.89);Difficulty  in walking, not elsewhere classified (R26.2);Other symptoms and signs involving the nervous system (N46.270)     Time: 3500-9381 PT Time Calculation (min) (ACUTE ONLY): 24 min  Charges:  $Gait Training: 8-22 mins $Therapeutic Activity: 8-22 mins                     Maryela Tapper H. Manson Passey, PT, DPT, NCS 12/14/20, 10:15 PM [phone]

## 2020-12-14 NOTE — Progress Notes (Signed)
Inpatient Rehabilitation Admissions Coordinator  CIR bed is not available to admit her today. I await updated therapy assessment today. I have discussed with Darl Pikes, RN CM and RN, Leeroy Bock.  Ottie Glazier, RN, MSN Rehab Admissions Coordinator [phone] 12/14/2020 10:44 AM

## 2020-12-14 NOTE — Progress Notes (Signed)
Dagoberto Reef, RN TOC and I discussed plan of care for pt today, to follow up with her today and organize plan for pt.

## 2020-12-14 NOTE — Progress Notes (Signed)
Post Partum Day 8  Subjective: up ad lib and using walker to ambulate   Doing well, no concerns. Ambulating with RN assist and assistive devices, pain managed with PO meds, tolerating regular diet, and voiding without difficulty.   No fever/chills, chest pain, shortness of breath, nausea/vomiting, or leg pain. No nipple or breast pain. No headache, visual changes, or RUQ/epigastric pain.  Objective: BP 107/66 (BP Location: Left Arm)   Pulse (!) 59   Temp (!) 97.5 F (36.4 C) (Oral)   Resp 16   Ht 5\' 9"  (1.753 m)   Wt 123.4 kg   LMP 04/04/2020   SpO2 97%   Breastfeeding Unknown   BMI 40.17 kg/m    Physical Exam:  General: alert, cooperative and no distress Breasts: soft/nontender CV: RRR Pulm: nl effort, CTABL Abdomen: soft, non-tender, active bowel sounds Uterine Fundus: firm Incision: healing well Perineum: minimal edema, intact Lochia: appropriate DVT Evaluation: No evidence of DVT seen on physical exam.  Recent Labs    12/13/20 0625  HGB 9.7*  HCT 29.6*  WBC 7.4  PLT 324    Assessment/Plan: 25 y.o. 25 postpartum day # 8  -Continue routine postpartum care, stable for discharge from OB standpoint  -Pumping breast milk for infant  -Acute blood loss anemia - hemodynamically stable and asymptomatic; continue PO ferrous sulfate BID with stool softeners  -Immunization status: Needs varicella prior to discharge -Neuro and TOC managing care related to paresthesia issues. Continues with PT/OT, Orthotics ordered today to help with stability and foot drop. -Discussed discharge today with appropriate assistive devices for home needs but she states that her husband returned to [id number] yesterday.  He needed to return to work.  Her infant is staying with family locally and she is unsure if she can stay with them given her current needs.  Will have a discussion with transition of care and PT to discuss patient needs and potential for discharge versus CIR placement.    -Obstetrically, she is stable for discharge but she will need PT/OT support outpatient.    Disposition: Continue inpatient postpartum care until able to safely discharge d/t PT/OT home needs.    LOS: 8 days   [place], Gustavo Lah 12/14/2020, 8:45 AM   ----- 02/11/2021  Certified Nurse Midwife [facility]

## 2020-12-14 NOTE — TOC Progression Note (Signed)
Transition of Care [facility]) - Progression Note    Patient Details  Name: Robyn Dunn MRN: [id number] Date of Birth: 1996-06-10  Transition of Care [facility]) CM/SW Contact  [place] Cellar, RN Phone Number: 12/14/2020, 9:53 AM  Clinical Narrative:    Patient originally waiting for bed availability at [facility] however no expected beds for 1-2 weeks. Patient with much improvement over the weekend and anticipating possible discharge home with sister who is local and currently caring for infant. FOB had to return to [place] for work and is not able to transport back to [place] for a couple weeks. TOC will follow up with patient and family to clarify discharge plan and include CIR to ensure no sooner availability.        Expected Discharge Plan and Services                                                 Social Determinants of Health (SDOH) Interventions    Readmission Risk Interventions No flowsheet data found.

## 2020-12-14 NOTE — Progress Notes (Addendum)
Inpatient Rehabilitation Admissions Coordinator  I met with patient at bedside using [place] #[id number] to interpret. Patient continues to say that her sister from [place] can assist with her care, the 25 year old and infant when her husband works. Same caregiver support that she discussed with me 1/6. She states she has contacted her sister who was in [place], that sister is on her way tonight to pick her up, the infant and return to [place] to return home. She asks that she have a wheelchair and walker for her d/c home.  I contacted Manuela Schwartz RN CM of DME needs and discussed with nursing.  Her niece in [place] is currently caring for the infant.  Danne Baxter, RN, MSN Rehab Admissions Coordinator [phone] 12/14/2020 5:18 PM   I contacted Barbette Or with TOC to request assistance in getting a wheelchair for patient d/c. Ty Hilts will arrange it to be delivered from [employer] tonight after 10 pm. Vikki Ports, RN aware and patient willing to remain until wheelchair delivered.  Danne Baxter, RN, MSN Rehab Admissions Coordinator [phone] 12/14/2020 6:16 PM

## 2020-12-15 LAB — COPPER, SERUM: Copper: 198 ug/dL — ABNORMAL HIGH (ref 80–158)

## 2020-12-16 LAB — HEAVY METALS, BLOOD
Arsenic: <1 ug/L — ABNORMAL LOW (ref 2–23)
Lead: 1 ug/dL (ref 0–4)
Mercury: <1 ug/L (ref 0.0–14.9)

## 2020-12-16 LAB — VITAMIN E
Vitamin E (Alpha Tocopherol): 15.4 mg/L (ref 5.9–19.4)
Vitamin E(Gamma Tocopherol): 1.6 mg/L (ref 0.7–4.9)

## 2020-12-17 LAB — MISC LABCORP TEST (SEND OUT): Labcorp test code: 140280

## 2020-12-24 LAB — VITAMIN B1: Vitamin B1 (Thiamine): 84.4 nmol/L (ref 66.5–200.0)

## 2022-08-11 ENCOUNTER — Emergency Department
Admission: EM | Admit: 2022-08-11 | Discharge: 2022-08-11 | Disposition: A | Discharge status: Home / Self Care | Payer: Self-pay | Attending: Emergency Medicine | Admitting: Emergency Medicine

## 2022-08-11 ENCOUNTER — Other Ambulatory Visit: Payer: Self-pay

## 2022-08-11 ENCOUNTER — Emergency Department: Payer: Self-pay

## 2022-08-11 DIAGNOSIS — N3001 Acute cystitis with hematuria: Secondary | ICD-10-CM | POA: Insufficient documentation

## 2022-08-11 LAB — CBC
HCT: 36.8 % (ref 36.0–46.0)
Hemoglobin: 11.7 g/dL — ABNORMAL LOW (ref 12.0–15.0)
MCH: 29.4 pg (ref 26.0–34.0)
MCHC: 31.8 g/dL (ref 30.0–36.0)
MCV: 92.5 fL (ref 80.0–100.0)
Platelets: 266 10*3/uL (ref 150–400)
RBC: 3.98 MIL/uL (ref 3.87–5.11)
RDW: 13.1 % (ref 11.5–15.5)
WBC: 17.7 10*3/uL — ABNORMAL HIGH (ref 4.0–10.5)
nRBC: 0 % (ref 0.0–0.2)

## 2022-08-11 LAB — URINALYSIS, ROUTINE W REFLEX MICROSCOPIC
Bilirubin Urine: NEGATIVE
Glucose, UA: NEGATIVE mg/dL
Ketones, ur: NEGATIVE mg/dL
Nitrite: POSITIVE — AB
Protein, ur: 100 mg/dL — AB
RBC / HPF: >50 RBC/hpf — ABNORMAL HIGH (ref 0–5)
Specific Gravity, Urine: 1.024 (ref 1.005–1.030)
WBC, UA: >50 WBC/hpf — ABNORMAL HIGH (ref 0–5)
pH: 5 (ref 5.0–8.0)

## 2022-08-11 LAB — BASIC METABOLIC PANEL
Anion gap: 9 (ref 5–15)
BUN: 23 mg/dL — ABNORMAL HIGH (ref 6–20)
CO2: 21 mmol/L — ABNORMAL LOW (ref 22–32)
Calcium: 9.2 mg/dL (ref 8.9–10.3)
Chloride: 109 mmol/L (ref 98–111)
Creatinine, Ser: 0.68 mg/dL (ref 0.44–1.00)
GFR, Estimated: >60 mL/min (ref >60)
Glucose, Bld: 93 mg/dL (ref 70–99)
Potassium: 3.6 mmol/L (ref 3.5–5.1)
Sodium: 139 mmol/L (ref 135–145)

## 2022-08-11 LAB — POC URINE PREG, ED: Preg Test, Ur: NEGATIVE

## 2022-08-11 MED ORDER — KETOROLAC TROMETHAMINE 30 MG/ML IJ SOLN
30.0000 mg | Freq: Once | INTRAMUSCULAR | Status: AC
  Administered 2022-08-11: 30 mg via INTRAVENOUS
  Filled 2022-08-11: qty 1

## 2022-08-11 MED ORDER — CEPHALEXIN 500 MG PO CAPS: 500.0000 mg | ORAL_CAPSULE | Freq: Three times a day (TID) | ORAL | 0 refills | Status: AC

## 2022-08-11 MED ORDER — SODIUM CHLORIDE 0.9 % IV SOLN
1.0000 g | Freq: Once | INTRAVENOUS | Status: AC
  Administered 2022-08-11: 1 g via INTRAVENOUS
  Filled 2022-08-11: qty 10

## 2022-08-11 NOTE — ED Provider Notes (Signed)
[facility] Emergency Department Provider Note     Event Date/Time   First MD Initiated Contact with Patient 08/11/22 1935     (approximate)   History   Flank Pain   HPI  History limited by Spanish language.  Interpreter used for interview and exam.  Robyn Dunn is a 26 y.o. female presents to the ED for evaluation of right-sided flank pain with hematuria.  Patient denies any fevers, chills, or history of kidney stones.    Physical Exam   Triage Vital Signs: ED Triage Vitals  Enc Vitals Group     BP 08/11/22 1809 (!) 109/58     Pulse Rate 08/11/22 1809 76     Resp 08/11/22 1809 17     Temp 08/11/22 1809 98 F (36.7 C)     Temp Source 08/11/22 1809 Oral     SpO2 08/11/22 1809 96 %     Weight --      Height --      Head Circumference --      Peak Flow --      Pain Score 08/11/22 1810 6     Pain Loc --      Pain Edu? --      Excl. in GC? --     Most recent vital signs: Vitals:   08/11/22 1809  BP: (!) 109/58  Pulse: 76  Resp: 17  Temp: 98 F (36.7 C)  SpO2: 96%    General Awake, no distress. NAD CVS  Good peripheral perfusion.  RESP:  Normal effort.  ABD:  No distention.  Soft, nontender.  Flank tenderness on the right    ED Results / Procedures / Treatments   Labs (all labs ordered are listed, but only abnormal results are displayed) Labs Reviewed  URINALYSIS, ROUTINE W REFLEX MICROSCOPIC - Abnormal; Notable for the following components:      Result Value   Color, Urine YELLOW (*)    APPearance CLOUDY (*)    Hgb urine dipstick MODERATE (*)    Protein, ur 100 (*)    Nitrite POSITIVE (*)    Leukocytes,Ua LARGE (*)    RBC / HPF >50 (*)    WBC, UA >50 (*)    Bacteria, UA MANY (*)    Non Squamous Epithelial PRESENT (*)    All other components within normal limits  BASIC METABOLIC PANEL - Abnormal; Notable for the following components:   CO2 21 (*)    BUN 23 (*)    All other components within normal  limits  CBC - Abnormal; Notable for the following components:   WBC 17.7 (*)    Hemoglobin 11.7 (*)    All other components within normal limits  URINE CULTURE  POC URINE PREG, ED     EKG   RADIOLOGY  I personally viewed and evaluated these images as part of my medical decision making, as well as reviewing the written report by the radiologist.  ED Provider Interpretation: no hydronephrosis or nephrolithiasis  CT Renal Stone Study  Result Date: 08/11/2022 CLINICAL DATA:  Right flank pain and hematuria. EXAM: CT ABDOMEN AND PELVIS WITHOUT CONTRAST TECHNIQUE: Multidetector CT imaging of the abdomen and pelvis was performed following the standard protocol without IV contrast. RADIATION DOSE REDUCTION: This exam was performed according to the departmental dose-optimization program which includes automated exposure control, adjustment of the mA and/or kV according to patient size and/or use of iterative reconstruction technique. COMPARISON:  None Available. FINDINGS: Lower  chest: No acute abnormality. Hepatobiliary: No focal liver abnormality is seen. No gallstones, gallbladder wall thickening, or biliary dilatation. Pancreas: Unremarkable. No pancreatic ductal dilatation or surrounding inflammatory changes. Spleen: Normal in size without focal abnormality. Adrenals/Urinary Tract: Adrenal glands are unremarkable. Kidneys are normal, without renal calculi, focal lesion, or hydronephrosis. The urinary bladder is poorly distended and subsequently limited in evaluation. Stomach/Bowel: Stomach is within normal limits. Appendix appears normal. No evidence of bowel wall thickening, distention, or inflammatory changes. Vascular/Lymphatic: No significant vascular findings are present. A cluster of subcentimeter mesenteric cysts are seen within the right lower quadrant. Reproductive: An IUD is seen within an otherwise normal appearing uterus. A 2.1 cm x 1.6 cm simple cyst is seen within the right adnexa. The  left adnexa is unremarkable. Other: No abdominal wall hernia or abnormality. No abdominopelvic ascites. Musculoskeletal: No acute or significant osseous findings. IMPRESSION: 1. Cluster of subcentimeter mesenteric cysts within the right lower quadrant, which may represent sequelae associated with mesenteric adenitis. 2. 2.1 cm x 1.6 cm simple cyst within the right adnexa, likely ovarian in origin. No additional follow-up or imaging is recommended. This recommendation follows ACR consensus guidelines: White Paper of the ACR Incidental Findings Committee II on Adnexal Findings. J Am Coll Radiol [phone]. Electronically Signed   By: Virgina Norfolk M.D.   On: 08/11/2022 20:39     PROCEDURES:  Critical Care performed: No  Procedures   MEDICATIONS ORDERED IN ED: Medications  cefTRIAXone (ROCEPHIN) 1 g in sodium chloride 0.9 % 100 mL IVPB (0 g Intravenous Stopped 08/11/22 2146)  ketorolac (TORADOL) 30 MG/ML injection 30 mg (30 mg Intravenous Given 08/11/22 2116)     IMPRESSION / MDM / ASSESSMENT AND PLAN / ED COURSE  I reviewed the triage vital signs and the nursing notes.                              Differential diagnosis includes, but is not limited to,  ovarian cyst, ovarian torsion, acute appendicitis, diverticulitis, urinary tract infection/pyelonephritis, endometriosis, bowel obstruction, colitis, renal colic, gastroenteritis, hernia, fibroids, endometriosis, pregnancy related pain including ectopic pregnancy, etc.   Patient's presentation is most consistent with acute complicated illness / injury requiring diagnostic workup.  Patient to the ED for evaluation of left flank pain as well as hematuria.  She presents in no acute distress.  She is evaluated for complaints in the ED, found to have a work-up concerning for possible UTI versus infected stone versus pyelonephritis.  Patient with an elevated white count of 17.7 as well as UA evidence of nitrite positive bacteriuria, treated  empirically with an IV dose of Rocephin.  No CT evidence of pyelonephritis, hydronephrosis, or infected stone.  Patient is in stable condition and stable for outpatient management as she is pain controlled at this time, and able to void without difficulty.  Patient's diagnosis is consistent with UTI. Patient will be discharged home with prescriptions for Keflex. Patient is to follow up with her PCP as needed or otherwise directed. Patient is given ED precautions to return to the ED for any worsening or new symptoms.     FINAL CLINICAL IMPRESSION(S) / ED DIAGNOSES   Final diagnoses:  Acute cystitis with hematuria     Rx / DC Orders   ED Discharge Orders          Ordered    cephALEXin (KEFLEX) 500 MG capsule  3 times daily  08/11/22 2107             Note:  This document was prepared using Dragon voice recognition software and may include unintentional dictation errors.    Lissa Hoard, PA-C 08/11/22 2317    Concha Se, MD 08/15/22 [phone]

## 2022-08-11 NOTE — ED Triage Notes (Signed)
Pt presents to ED with /co of R flank and hematuria. Pt denies HX of kidney stones, pt denies fever or chills. Pt does endorse dysuria.

## 2022-08-11 NOTE — Discharge Instructions (Addendum)
Take the antibiotic as directed.  Return to the ED for worsening pain, fevers, or continued bleeding.  Tome el antibitico segn las indicaciones. Regrese al servicio de urgencias si el dolor [place], tiene fiebre o sangra continuamente.

## 2022-08-11 NOTE — ED Provider Triage Note (Signed)
Emergency Medicine Provider Triage Evaluation Note  Robyn Dunn, a 26 y.o. female  was evaluated in triage.  Pt complains of urinary symptoms including flank pain and dysuria.  Review of Systems  Positive: dysuria Negative: FCS  Physical Exam  There were no vitals taken for this visit. Gen:   Awake, no distress  NAD Resp:  Normal effort CTA MSK:   Moves extremities without difficulty  ABD:  Soft, nontender  Medical Decision Making  Medically screening exam initiated at 6:02 PM.  Appropriate orders placed.  Robyn Dunn was informed that the remainder of the evaluation will be completed by another provider, this initial triage assessment does not replace that evaluation, and the importance of remaining in the ED until their evaluation is complete.  Patient to the ED for evaluation of dysuria and urinary frequency.   Lissa Hoard, PA-C 08/11/22 5366

## 2022-08-14 LAB — URINE CULTURE: Culture: >=100000 — AB

## 2022-10-14 LAB — METHYLMALONIC ACID, SERUM: Methylmalonic Acid, Quantitative: 178 nmol/L (ref 0–378)

## 2023-06-22 ENCOUNTER — Emergency Department: Payer: Self-pay

## 2023-06-22 ENCOUNTER — Encounter: Payer: Self-pay | Admitting: Emergency Medicine

## 2023-06-22 ENCOUNTER — Emergency Department
Admission: EM | Admit: 2023-06-22 | Discharge: 2023-06-22 | Disposition: A | Discharge status: Home / Self Care | Payer: Self-pay | Attending: Emergency Medicine | Admitting: Emergency Medicine

## 2023-06-22 DIAGNOSIS — K8050 Calculus of bile duct without cholangitis or cholecystitis without obstruction: Secondary | ICD-10-CM

## 2023-06-22 DIAGNOSIS — K8020 Calculus of gallbladder without cholecystitis without obstruction: Secondary | ICD-10-CM | POA: Insufficient documentation

## 2023-06-22 LAB — COMPREHENSIVE METABOLIC PANEL
ALT: 14 U/L (ref 0–44)
AST: 18 U/L (ref 15–41)
Albumin: 4.1 g/dL (ref 3.5–5.0)
Alkaline Phosphatase: 94 U/L (ref 38–126)
Anion gap: 9 (ref 5–15)
BUN: 25 mg/dL — ABNORMAL HIGH (ref 6–20)
CO2: 18 mmol/L — ABNORMAL LOW (ref 22–32)
Calcium: 8.9 mg/dL (ref 8.9–10.3)
Chloride: 107 mmol/L (ref 98–111)
Creatinine, Ser: 0.77 mg/dL (ref 0.44–1.00)
GFR, Estimated: >60 mL/min (ref >60)
Glucose, Bld: 140 mg/dL — ABNORMAL HIGH (ref 70–99)
Potassium: 3.3 mmol/L — ABNORMAL LOW (ref 3.5–5.1)
Sodium: 134 mmol/L — ABNORMAL LOW (ref 135–145)
Total Bilirubin: 0.5 mg/dL (ref 0.3–1.2)
Total Protein: 8.1 g/dL (ref 6.5–8.1)

## 2023-06-22 LAB — CBC WITH DIFFERENTIAL/PLATELET
Abs Immature Granulocytes: 0.04 10*3/uL (ref 0.00–0.07)
Basophils Absolute: 0 10*3/uL (ref 0.0–0.1)
Basophils Relative: 0 %
Eosinophils Absolute: 0.2 10*3/uL (ref 0.0–0.5)
Eosinophils Relative: 1 %
HCT: 39.9 % (ref 36.0–46.0)
Hemoglobin: 13.1 g/dL (ref 12.0–15.0)
Immature Granulocytes: 0 %
Lymphocytes Relative: 31 %
Lymphs Abs: 3.6 10*3/uL (ref 0.7–4.0)
MCH: 30.3 pg (ref 26.0–34.0)
MCHC: 32.8 g/dL (ref 30.0–36.0)
MCV: 92.1 fL (ref 80.0–100.0)
Monocytes Absolute: 0.8 10*3/uL (ref 0.1–1.0)
Monocytes Relative: 7 %
Neutro Abs: 7 10*3/uL (ref 1.7–7.7)
Neutrophils Relative %: 61 %
Platelets: 264 10*3/uL (ref 150–400)
RBC: 4.33 MIL/uL (ref 3.87–5.11)
RDW: 12.8 % (ref 11.5–15.5)
WBC: 11.6 10*3/uL — ABNORMAL HIGH (ref 4.0–10.5)
nRBC: 0 % (ref 0.0–0.2)

## 2023-06-22 LAB — HCG, QUANTITATIVE, PREGNANCY: hCG, Beta Chain, Quant, S: 1 m[IU]/mL (ref <5)

## 2023-06-22 LAB — LIPASE, BLOOD: Lipase: 30 U/L (ref 11–51)

## 2023-06-22 MED ORDER — HYDROMORPHONE HCL 1 MG/ML IJ SOLN
0.5000 mg | Freq: Once | INTRAMUSCULAR | Status: AC
  Administered 2023-06-22: 0.5 mg via INTRAVENOUS
  Filled 2023-06-22: qty 0.5

## 2023-06-22 MED ORDER — MORPHINE SULFATE (PF) 4 MG/ML IV SOLN
4.0000 mg | Freq: Once | INTRAVENOUS | Status: AC
  Administered 2023-06-22: 4 mg via INTRAVENOUS
  Filled 2023-06-22: qty 1

## 2023-06-22 MED ORDER — ONDANSETRON HCL 4 MG/2ML IJ SOLN
4.0000 mg | Freq: Once | INTRAMUSCULAR | Status: AC
  Administered 2023-06-22: 4 mg via INTRAVENOUS
  Filled 2023-06-22: qty 2

## 2023-06-22 MED ORDER — LACTATED RINGERS IV BOLUS
1000.0000 mL | Freq: Once | INTRAVENOUS | Status: AC
  Administered 2023-06-22: 1000 mL via INTRAVENOUS

## 2023-06-22 MED ORDER — ONDANSETRON 4 MG PO TBDP: 4.0000 mg | ORAL_TABLET | Freq: Three times a day (TID) | ORAL | 0 refills | Status: DC | PRN

## 2023-06-22 MED ORDER — KETOROLAC TROMETHAMINE 15 MG/ML IJ SOLN
15.0000 mg | Freq: Once | INTRAMUSCULAR | Status: AC
  Administered 2023-06-22: 15 mg via INTRAVENOUS
  Filled 2023-06-22: qty 1

## 2023-06-22 MED ORDER — OXYCODONE-ACETAMINOPHEN 5-325 MG PO TABS: 1.0000 | ORAL_TABLET | ORAL | 0 refills | Status: AC | PRN

## 2023-06-22 MED ORDER — OXYCODONE-ACETAMINOPHEN 5-325 MG PO TABS
1.0000 | ORAL_TABLET | Freq: Once | ORAL | Status: AC
  Administered 2023-06-22: 1 via ORAL
  Filled 2023-06-22: qty 1

## 2023-06-22 NOTE — ED Provider Notes (Signed)
[facility] Provider Note    Event Date/Time   First MD Initiated Contact with Patient 06/22/23 [phone]     (approximate)   History   Chief Complaint Abdominal Pain   HPI  Robyn Dunn is a 27 y.o. female with no significant past medical history who presents to the ED complaining of abdominal pain.  History is limited as patient is Spanish-speaking only, history obtained via interpreter [phone].  Patient reports that she was woken from sleep about 1 hour prior to arrival with severe pain in her epigastric area.  This been associated with nausea and multiple episodes of vomiting, patient denies any diarrhea.  She was feeling fine when she went to sleep, denies any recent fevers, dysuria, hematuria, or flank pain.  She denies any history of similar symptoms, has never had surgery on her abdomen outside of 2 C-sections.     Physical Exam   Triage Vital Signs: ED Triage Vitals  Encounter Vitals Group     BP 06/22/23 0346 113/81     Systolic BP Percentile --      Diastolic BP Percentile --      Pulse Rate 06/22/23 0346 92     Resp 06/22/23 0346 18     Temp --      Temp src --      SpO2 06/22/23 0346 100 %     Weight 06/22/23 0343 273 lb 13 oz (124.2 kg)     Height 06/22/23 0343 5\' 9"  (1.753 m)     Head Circumference --      Peak Flow --      Pain Score 06/22/23 0343 10     Pain Loc --      Pain Education --      Exclude from Growth Chart --     Most recent vital signs: Vitals:   06/22/23 0346  BP: 113/81  Pulse: 92  Resp: 18  SpO2: 100%    Constitutional: Alert and oriented. Eyes: Conjunctivae are normal. Head: Atraumatic. Nose: No congestion/rhinnorhea. Mouth/Throat: Mucous membranes are moist.  Cardiovascular: Normal rate, regular rhythm. Grossly normal heart sounds.  2+ radial pulses bilaterally. Respiratory: Normal respiratory effort.  No retractions. Lungs CTAB. Gastrointestinal: Soft and tender to palpation in the  epigastrium and right upper quadrant with no rebound or guarding. No distention. Musculoskeletal: No lower extremity tenderness nor edema.  Neurologic:  Normal speech and language. No gross focal neurologic deficits are appreciated.    ED Results / Procedures / Treatments   Labs (all labs ordered are listed, but only abnormal results are displayed) Labs Reviewed  CBC WITH DIFFERENTIAL/PLATELET - Abnormal; Notable for the following components:      Result Value   WBC 11.6 (*)    All other components within normal limits  COMPREHENSIVE METABOLIC PANEL - Abnormal; Notable for the following components:   Sodium 134 (*)    Potassium 3.3 (*)    CO2 18 (*)    Glucose, Bld 140 (*)    BUN 25 (*)    All other components within normal limits  LIPASE, BLOOD  HCG, QUANTITATIVE, PREGNANCY  URINALYSIS, ROUTINE W REFLEX MICROSCOPIC  POC URINE PREG, ED     EKG  ED ECG REPORT I, Chesley Noon, the attending physician, personally viewed and interpreted this ECG.   Date: 06/22/2023  EKG Time: 3:48  Rate: 85  Rhythm: normal sinus rhythm  Axis: Normal  Intervals:none  ST&T Change: None  RADIOLOGY Right upper quadrant ultrasound  reviewed and interpreted by me with gallstones, no wall thickening or pericholecystic fluid noted.  PROCEDURES:  Critical Care performed: No  Procedures   MEDICATIONS ORDERED IN ED: Medications  morphine (PF) 4 MG/ML injection 4 mg (4 mg Intravenous Given 06/22/23 0356)  ondansetron (ZOFRAN) injection 4 mg (4 mg Intravenous Given 06/22/23 0356)  lactated ringers bolus 1,000 mL (0 mLs Intravenous Stopped 06/22/23 0455)  oxyCODONE-acetaminophen (PERCOCET/ROXICET) 5-325 MG per tablet 1 tablet (1 tablet Oral Given 06/22/23 0523)  HYDROmorphone (DILAUDID) injection 0.5 mg (0.5 mg Intravenous Given 06/22/23 0617)     IMPRESSION / MDM / ASSESSMENT AND PLAN / ED COURSE  I reviewed the triage vital signs and the nursing notes.                              27  y.o. female with no significant past medical history presents to the ED complaining of sudden onset epigastric and right upper quadrant abdominal pain associated with nausea and vomiting.  Patient's presentation is most consistent with acute presentation with potential threat to life or bodily function.  Differential diagnosis includes, but is not limited to, cholecystitis, biliary colic, pancreatitis, hepatitis, gastritis, ectopic pregnancy, anemia, electrolyte abnormality, AKI.  Patient uncomfortable appearing, clutching her abdomen and actively vomiting.  Her abdomen is soft but she does appear to have focal tenderness in the epigastrium and right upper quadrant.  We will further assess with right upper quadrant ultrasound, treat symptomatically with IV morphine and Zofran.  Labs and urine are pending at this time.  Right upper quadrant ultrasound shows gallstones but with no findings concerning for cholecystitis.  Patient continued to have significant pain despite IV morphine and Percocet, subsequently had relief following a dose of IV Dilaudid.  She is tolerating oral intake without difficulty and is appropriate for outpatient follow-up with general surgery for biliary colic.  She was prescribed pain and nausea medication, counseled to return to the ED for new or worsening symptoms.  Patient agrees with plan.      FINAL CLINICAL IMPRESSION(S) / ED DIAGNOSES   Final diagnoses:  Biliary colic     Rx / DC Orders   ED Discharge Orders          Ordered    oxyCODONE-acetaminophen (PERCOCET) 5-325 MG tablet  Every 4 hours PRN        06/22/23 0650    ondansetron (ZOFRAN-ODT) 4 MG disintegrating tablet  Every 8 hours PRN        06/22/23 0650             Note:  This document was prepared using Dragon voice recognition software and may include unintentional dictation errors.   Chesley Noon, MD 06/22/23 [phone]

## 2023-06-22 NOTE — ED Triage Notes (Signed)
Pt presents ambulatory to triage via POV with complaints of RUQ/epigastric pain that woke her up from her sleep. Pt is actively vomiting at this time and states that she vomited PTA. A&Ox4 at this time. Denies CP or SOB.

## 2023-11-03 ENCOUNTER — Other Ambulatory Visit: Payer: Self-pay

## 2023-11-03 ENCOUNTER — Emergency Department: Payer: Self-pay

## 2023-11-03 ENCOUNTER — Emergency Department
Admission: EM | Admit: 2023-11-03 | Discharge: 2023-11-03 | Disposition: A | Discharge status: Home / Self Care | Payer: Self-pay | Attending: Emergency Medicine | Admitting: Emergency Medicine

## 2023-11-03 DIAGNOSIS — R109 Unspecified abdominal pain: Secondary | ICD-10-CM | POA: Insufficient documentation

## 2023-11-03 DIAGNOSIS — N12 Tubulo-interstitial nephritis, not specified as acute or chronic: Secondary | ICD-10-CM

## 2023-11-03 DIAGNOSIS — R110 Nausea: Secondary | ICD-10-CM | POA: Insufficient documentation

## 2023-11-03 DIAGNOSIS — R300 Dysuria: Secondary | ICD-10-CM | POA: Insufficient documentation

## 2023-11-03 LAB — COMPREHENSIVE METABOLIC PANEL
ALT: 15 U/L (ref 0–44)
AST: 19 U/L (ref 15–41)
Albumin: 4 g/dL (ref 3.5–5.0)
Alkaline Phosphatase: 88 U/L (ref 38–126)
Anion gap: 11 (ref 5–15)
BUN: 30 mg/dL — ABNORMAL HIGH (ref 6–20)
CO2: 22 mmol/L (ref 22–32)
Calcium: 9 mg/dL (ref 8.9–10.3)
Chloride: 106 mmol/L (ref 98–111)
Creatinine, Ser: 0.7 mg/dL (ref 0.44–1.00)
GFR, Estimated: >60 mL/min (ref >60)
Glucose, Bld: 112 mg/dL — ABNORMAL HIGH (ref 70–99)
Potassium: 3.7 mmol/L (ref 3.5–5.1)
Sodium: 139 mmol/L (ref 135–145)
Total Bilirubin: 0.3 mg/dL (ref <1.2)
Total Protein: 7.8 g/dL (ref 6.5–8.1)

## 2023-11-03 LAB — URINALYSIS, ROUTINE W REFLEX MICROSCOPIC
Bilirubin Urine: NEGATIVE
Glucose, UA: NEGATIVE mg/dL
Ketones, ur: NEGATIVE mg/dL
Nitrite: POSITIVE — AB
Protein, ur: NEGATIVE mg/dL
Specific Gravity, Urine: 1.015 (ref 1.005–1.030)
WBC, UA: >50 WBC/hpf (ref 0–5)
pH: 5 (ref 5.0–8.0)

## 2023-11-03 LAB — LIPASE, BLOOD: Lipase: 24 U/L (ref 11–51)

## 2023-11-03 LAB — CBC
HCT: 38.2 % (ref 36.0–46.0)
Hemoglobin: 12.5 g/dL (ref 12.0–15.0)
MCH: 30.9 pg (ref 26.0–34.0)
MCHC: 32.7 g/dL (ref 30.0–36.0)
MCV: 94.3 fL (ref 80.0–100.0)
Platelets: 182 10*3/uL (ref 150–400)
RBC: 4.05 MIL/uL (ref 3.87–5.11)
RDW: 12.4 % (ref 11.5–15.5)
WBC: 9 10*3/uL (ref 4.0–10.5)
nRBC: 0 % (ref 0.0–0.2)

## 2023-11-03 LAB — HCG, QUANTITATIVE, PREGNANCY: hCG, Beta Chain, Quant, S: <1 m[IU]/mL (ref <5)

## 2023-11-03 MED ORDER — OXYCODONE-ACETAMINOPHEN 5-325 MG PO TABS: 1.0000 | ORAL_TABLET | Freq: Four times a day (QID) | ORAL | 0 refills | Status: AC | PRN

## 2023-11-03 MED ORDER — ONDANSETRON 4 MG PO TBDP: 4.0000 mg | ORAL_TABLET | Freq: Three times a day (TID) | ORAL | 0 refills | Status: AC | PRN

## 2023-11-03 MED ORDER — SULFAMETHOXAZOLE-TRIMETHOPRIM 800-160 MG PO TABS
1.0000 | ORAL_TABLET | Freq: Once | ORAL | Status: AC
  Administered 2023-11-03: 1 via ORAL
  Filled 2023-11-03: qty 1

## 2023-11-03 MED ORDER — ONDANSETRON HCL 4 MG/2ML IJ SOLN
4.0000 mg | Freq: Once | INTRAMUSCULAR | Status: AC
  Administered 2023-11-03: 4 mg via INTRAVENOUS
  Filled 2023-11-03: qty 2

## 2023-11-03 MED ORDER — HYDROMORPHONE HCL 1 MG/ML IJ SOLN
0.5000 mg | Freq: Once | INTRAMUSCULAR | Status: AC
  Administered 2023-11-03: 0.5 mg via INTRAVENOUS
  Filled 2023-11-03: qty 0.5

## 2023-11-03 MED ORDER — KETOROLAC TROMETHAMINE 30 MG/ML IJ SOLN
15.0000 mg | Freq: Once | INTRAMUSCULAR | Status: AC
  Administered 2023-11-03: 15 mg via INTRAVENOUS
  Filled 2023-11-03: qty 1

## 2023-11-03 MED ORDER — SULFAMETHOXAZOLE-TRIMETHOPRIM 800-160 MG PO TABS: 1.0000 | ORAL_TABLET | Freq: Two times a day (BID) | ORAL | 0 refills | Status: AC

## 2023-11-03 MED ORDER — SODIUM CHLORIDE 0.9 % IV BOLUS
1000.0000 mL | Freq: Once | INTRAVENOUS | Status: AC
  Administered 2023-11-03: 1000 mL via INTRAVENOUS

## 2023-11-03 NOTE — ED Notes (Signed)
Urine sent to lab 

## 2023-11-03 NOTE — ED Provider Notes (Signed)
Procedures  Clinical Course as of 11/03/23 4098  [facility] Nov 03, 2023  1191 Laboratory results unremarkable.  Awaiting UA, pregnancy, CT scan.  Care be transferred to Dr. Scotty Court at change of shift pending results and disposition. [JS]    Clinical Course User Index [JS] Irean Hong, MD    ----------------------------------------- 8:28 AM on 11/03/2023 ----------------------------------------- CT negative for ureterolithiasis.  Does suggest pyelonephritis.  Urinalysis is consistent with UTI.  Patient reports feeling a bit better.  Vital signs stable.  Creatinine normal.  Suitable for oral antibiotics and outpatient follow-up.  Final diagnoses:  Right flank pain  Pyelonephritis        Sharman Cheek, MD 11/03/23 [phone]

## 2023-11-03 NOTE — ED Provider Notes (Signed)
[facility] Provider Note    Event Date/Time   First MD Initiated Contact with Patient 11/03/23 [phone]     (approximate)   History   Flank Pain   HPI  History obtained via tele-Spanish interpreter  Robyn Dunn is a 27 y.o. female who presents to the ED from home with a chief complaint of right flank pain.  Describes sudden onset flank pain 3 hours ago awakening patient from sleep.  Reports dysuria.  Endorses nausea.  Denies fever/chills, chest pain, shortness of breath, vomiting, diarrhea.     Past Medical History  History reviewed. No pertinent past medical history.   Active Problem List   Patient Active Problem List   Diagnosis Date Noted   Person with feared complaint in whom no diagnosis is made 12/09/2020   Preterm premature rupture of membranes in third trimester 12/06/2020   Labor and delivery indication for care or intervention 12/06/2020   Previous cesarean delivery affecting pregnancy 12/06/2020   Postoperative state 12/06/2020     Past Surgical History   Past Surgical History:  Procedure Laterality Date   CESAREAN SECTION     CESAREAN SECTION  12/06/2020   Procedure: CESAREAN SECTION;  Surgeon: Schermerhorn, Ihor Austin, MD;  Location: [facility] ORS;  Service: Obstetrics;;     Home Medications   Prior to Admission medications   Medication Sig Start Date End Date Taking? Authorizing Provider  acetaminophen (TYLENOL) 500 MG tablet Take 2 tablets (1,000 mg total) by mouth every 6 (six) hours. 12/14/20   Christeen Douglas, MD  gabapentin (NEURONTIN) 300 MG capsule Take 1 capsule (300 mg total) by mouth at bedtime. 12/15/20   Christeen Douglas, MD  ibuprofen (ADVIL) 800 MG tablet Take 1 tablet (800 mg total) by mouth every 6 (six) hours. 12/15/20   Christeen Douglas, MD  ondansetron (ZOFRAN-ODT) 4 MG disintegrating tablet Take 1 tablet (4 mg total) by mouth every 8 (eight) hours as needed for nausea or vomiting. 06/22/23   Chesley Noon, MD  oxyCODONE-acetaminophen (PERCOCET) 5-325 MG tablet Take 1 tablet by mouth every 4 (four) hours as needed for severe pain. 06/22/23 06/21/24  Chesley Noon, MD  Prenatal Vit-Fe Fumarate-FA (PRENATAL MULTIVITAMIN) TABS tablet Take 1 tablet by mouth daily at 12 noon.    [provider]     Allergies  Patient has no known allergies.   Family History   Family History  Problem Relation Age of Onset   Diabetes Mother    Diabetes Maternal Grandmother      Physical Exam  Triage Vital Signs: ED Triage Vitals  Encounter Vitals Group     BP 11/03/23 0534 (!) 129/104     Systolic BP Percentile --      Diastolic BP Percentile --      Pulse Rate 11/03/23 0534 (!) 111     Resp 11/03/23 0534 20     Temp 11/03/23 0534 98.6 F (37 C)     Temp Source 11/03/23 0534 Oral     SpO2 11/03/23 0534 100 %     Weight 11/03/23 0533 185 lb (83.9 kg)     Height 11/03/23 0533 5\' 9"  (1.753 m)     Head Circumference --      Peak Flow --      Pain Score 11/03/23 0533 10     Pain Loc --      Pain Education --      Exclude from Growth Chart --     Updated Vital  Signs: BP (!) 129/104 (BP Location: Left Arm)   Pulse (!) 111   Temp 98.6 F (37 C) (Oral)   Resp 20   Ht 5\' 9"  (1.753 m)   Wt 83.9 kg   SpO2 100%   BMI 27.32 kg/m    General: Awake, moderate distress.  Tearful. CV:  Tachycardic.  Good peripheral perfusion.  Resp:  Normal effort.  CTAB. Abd:  Nontender.  Mild right CVAT.  No distention.  Other:  No truncal vesicles.   ED Results / Procedures / Treatments  Labs (all labs ordered are listed, but only abnormal results are displayed) Labs Reviewed  COMPREHENSIVE METABOLIC PANEL - Abnormal; Notable for the following components:      Result Value   Glucose, Bld 112 (*)    BUN 30 (*)    All other components within normal limits  CBC  LIPASE, BLOOD  URINALYSIS, ROUTINE W REFLEX MICROSCOPIC  HCG, QUANTITATIVE, PREGNANCY  POC URINE PREG, ED      EKG  None   RADIOLOGY Pending   Official radiology report(s): No results found.   PROCEDURES:  Critical Care performed: No  .1-3 Lead EKG Interpretation  Performed by: Irean Hong, MD Authorized by: Irean Hong, MD     Interpretation: abnormal     ECG rate:  110   ECG rate assessment: tachycardic     Rhythm: sinus tachycardia     Ectopy: none     Conduction: normal   Comments:     Patient placed on cardiac monitor to evaluate for arrhythmias    MEDICATIONS ORDERED IN ED: Medications  sodium chloride 0.9 % bolus 1,000 mL (1,000 mLs Intravenous New Bag/Given 11/03/23 0601)  ondansetron (ZOFRAN) injection 4 mg (4 mg Intravenous Given 11/03/23 0601)  HYDROmorphone (DILAUDID) injection 0.5 mg (0.5 mg Intravenous Given 11/03/23 0601)  ketorolac (TORADOL) 30 MG/ML injection 15 mg (15 mg Intravenous Given 11/03/23 0601)     IMPRESSION / MDM / ASSESSMENT AND PLAN / ED COURSE  I reviewed the triage vital signs and the nursing notes.                             27 year old female presenting with right flank pain. Differential diagnosis includes, but is not limited to, ovarian cyst, ovarian torsion, acute appendicitis, diverticulitis, urinary tract infection/pyelonephritis, endometriosis, bowel obstruction, colitis, renal colic, gastroenteritis, hernia, fibroids, endometriosis, pregnancy related pain including ectopic pregnancy, etc. I personally reviewed patient's records and note an ED visit for biliary colic on 06/22/2023.  Patient's presentation is most consistent with acute illness requiring diagnostic testing.  The patient is on the cardiac monitor to evaluate for evidence of arrhythmia and/or significant heart rate changes.  Will obtain basic lab work, UA.  CT renal colic study.  Initiate IV fluid hydration, IV Ketorolac and Dilaudid for pain, IV Zofran for nausea.  Will reassess.  Clinical Course as of 11/03/23 0658  Fri Nov 03, 2023  1914 Laboratory  results unremarkable.  Awaiting UA, pregnancy, CT scan.  Care be transferred to Dr. Scotty Court at change of shift pending results and disposition. [JS]    Clinical Course User Index [JS] Irean Hong, MD     FINAL CLINICAL IMPRESSION(S) / ED DIAGNOSES   Final diagnoses:  Right flank pain     Rx / DC Orders   ED Discharge Orders     None        Note:  This document was  prepared using Conservation officer, historic buildings and may include unintentional dictation errors.   Irean Hong, MD 11/03/23 [phone]

## 2023-11-03 NOTE — ED Notes (Signed)
Pt at CT

## 2023-11-03 NOTE — ED Triage Notes (Signed)
Pt reports R flank pain. Acute onset 3 hrs ago. Reports increased pain with urination. Denies burning with urination or other urinary symptoms. Reports nausea. Denies emesis. Pt tearful in triage. Alert and oriented. Breathing unlabored.

## 2023-11-03 NOTE — ED Notes (Signed)
Pt denizes any pain. Call bell within reach.

## 2024-05-15 ENCOUNTER — Encounter: Payer: Self-pay | Admitting: *Deleted

## 2024-05-15 ENCOUNTER — Other Ambulatory Visit: Payer: Self-pay

## 2024-05-15 ENCOUNTER — Emergency Department
Admission: EM | Admit: 2024-05-15 | Discharge: 2024-05-15 | Disposition: A | Discharge status: Home / Self Care | Payer: Self-pay

## 2024-05-15 DIAGNOSIS — N3001 Acute cystitis with hematuria: Secondary | ICD-10-CM | POA: Insufficient documentation

## 2024-05-15 DIAGNOSIS — R21 Rash and other nonspecific skin eruption: Secondary | ICD-10-CM | POA: Insufficient documentation

## 2024-05-15 DIAGNOSIS — B3731 Acute candidiasis of vulva and vagina: Secondary | ICD-10-CM | POA: Insufficient documentation

## 2024-05-15 LAB — URINALYSIS, ROUTINE W REFLEX MICROSCOPIC
Bilirubin Urine: NEGATIVE
Glucose, UA: NEGATIVE mg/dL
Ketones, ur: NEGATIVE mg/dL
Nitrite: NEGATIVE
Protein, ur: 30 mg/dL — AB
Specific Gravity, Urine: 1.027 (ref 1.005–1.030)
pH: 6 (ref 5.0–8.0)

## 2024-05-15 LAB — POC URINE PREG, ED: Preg Test, Ur: NEGATIVE

## 2024-05-15 MED ORDER — PHENAZOPYRIDINE HCL 200 MG PO TABS: 200.0000 mg | ORAL_TABLET | Freq: Three times a day (TID) | ORAL | 0 refills | Status: AC | PRN

## 2024-05-15 MED ORDER — PHENAZOPYRIDINE HCL 200 MG PO TABS: 200.0000 mg | ORAL_TABLET | Freq: Three times a day (TID) | ORAL | 0 refills | Status: DC | PRN

## 2024-05-15 MED ORDER — CEPHALEXIN 500 MG PO CAPS: 500.0000 mg | ORAL_CAPSULE | Freq: Three times a day (TID) | ORAL | 0 refills | Status: AC

## 2024-05-15 MED ORDER — LORATADINE 10 MG PO TABS
10.0000 mg | ORAL_TABLET | Freq: Once | ORAL | Status: AC
  Administered 2024-05-15: 10 mg via ORAL
  Filled 2024-05-15: qty 1

## 2024-05-15 MED ORDER — LORATADINE 10 MG PO TABS: 10.0000 mg | ORAL_TABLET | Freq: Every day | ORAL | 0 refills | Status: AC

## 2024-05-15 MED ORDER — CEPHALEXIN 500 MG PO CAPS: 500.0000 mg | ORAL_CAPSULE | Freq: Three times a day (TID) | ORAL | 0 refills | Status: DC

## 2024-05-15 MED ORDER — CEPHALEXIN 500 MG PO CAPS
500.0000 mg | ORAL_CAPSULE | Freq: Once | ORAL | Status: AC
  Administered 2024-05-15: 500 mg via ORAL
  Filled 2024-05-15: qty 1

## 2024-05-15 MED ORDER — PHENAZOPYRIDINE HCL 100 MG PO TABS
95.0000 mg | ORAL_TABLET | Freq: Once | ORAL | Status: AC
  Administered 2024-05-15: 100 mg via ORAL
  Filled 2024-05-15: qty 1

## 2024-05-15 MED ORDER — FLUCONAZOLE 150 MG PO TABS: 150.0000 mg | ORAL_TABLET | Freq: Every day | ORAL | 0 refills | Status: AC

## 2024-05-15 MED ORDER — LORATADINE 10 MG PO TABS: 10.0000 mg | ORAL_TABLET | Freq: Every day | ORAL | 0 refills | Status: DC

## 2024-05-15 NOTE — ED Triage Notes (Signed)
 Pt ambulatory to triage.  Pt has a rash all over body.   Pt reports itching.  Pt also has dysuria.    Interpreter on a stick used triage.   Pt alert

## 2024-05-15 NOTE — ED Provider Notes (Signed)
 [facility] Provider Note    Event Date/Time   First MD Initiated Contact with Patient 05/15/24 2157     (approximate)   History   Rash    HPI  Robyn Dunn is a 28 y.o. female    with a past medical history of pyelonephritis of right kidney, biliary colic, left foot drop, numbness in hands,, with no significant past medical history who presents to the ED complaining of thick rash. According to the patient, symptoms started 24 hours ago with a rash on her face anterior thorax posterior thorax and abdomen and arms.  Patient denies fever.  Patient states having dysuria, frequency, white vaginal discharge that is itching.  Patient states a month ago she was positive for chlamydia and got treatment.  States she does not have any animals at home but the neighbor has several cats.     ROS: Patient currently denies any vision changes, tinnitus, difficulty speaking, facial droop, sore throat, chest pain, shortness of breath, abdominal pain, nausea/vomiting/diarrhea, dysuria, or weakness/numbness/paresthesias in any extremity   Physical Exam   Triage Vital Signs: ED Triage Vitals [05/15/24 2117]  Encounter Vitals Group     BP 114/64     Systolic BP Percentile      Diastolic BP Percentile      Pulse Rate 84     Resp 18     Temp 99.1 F (37.3 C)     Temp Source Oral     SpO2 97 %     Weight 184 lb (83.5 kg)     Height 5' 8 (1.727 m)     Head Circumference      Peak Flow      Pain Score 8     Pain Loc      Pain Education      Exclude from Growth Chart     Most recent vital signs: Vitals:   05/15/24 2117  BP: 114/64  Pulse: 84  Resp: 18  Temp: 99.1 F (37.3 C)  SpO2: 97%  Physical exam vitals and nursing note reviewed, vital signs are normal   Constitutional: Alert, NAD. Able to speak in complete sentences without cough or dyspnea  Eyes: Conjunctivae are normal.  Head: Atraumatic. Nose: No congestion/rhinnorhea. Mouth/Throat:  Mucous membranes are moist.   Neck: Painless ROM. Supple. No JVD, nodes, thyromegaly  Cardiovascular:   Good peripheral circulation.RRR no murmurs, gallops, rubs  Respiratory: Normal respiratory effort.  No retractions. Clear to auscultation bilaterally without wheezing or crackles  Gastrointestinal: Soft and nontender.  Bilateral CVA negative Musculoskeletal:  no deformity Neurologic:  MAE spontaneously. No gross focal neurologic deficits are appreciated.  Skin:  Skin is warm, dry and intact.  Maculopapular rash noted in anterior thorax, in the external area, posterior thorax rash, right arm in the anterior area, Psychiatric: Mood and affect are normal. Speech and behavior are normal.    ED Results / Procedures / Treatments   Labs (all labs ordered are listed, but only abnormal results are displayed) Labs Reviewed  URINALYSIS, ROUTINE W REFLEX MICROSCOPIC - Abnormal; Notable for the following components:      Result Value   Color, Urine YELLOW (*)    APPearance HAZY (*)    Hgb urine dipstick SMALL (*)    Protein, ur 30 (*)    Leukocytes,Ua MODERATE (*)    Bacteria, UA RARE (*)    All other components within normal limits  POC URINE PREG, ED     EKG  RADIOLOGY     PROCEDURES:  Critical Care performed:   Procedures   MEDICATIONS ORDERED IN ED: Medications  cephALEXin  (KEFLEX ) capsule 500 mg (has no administration in time range)  loratadine (CLARITIN) tablet 10 mg (has no administration in time range)  phenazopyridine (PYRIDIUM) tablet 100 mg (has no administration in time range)   Clinical Course as of 05/15/24 2347  Wed May 15, 2024  2320 Urinalysis, Routine w reflex microscopic -Urine, Clean Catch(!) Hemoglobin positive, leukocytes moderate, bacteria is rare [AE]  2320 POC Urine Pregnancy, ED Negative [AE]    Clinical Course User Index [AE] Robyn Lennox, PA-C    IMPRESSION / MDM / ASSESSMENT AND PLAN / ED COURSE  I reviewed the triage vital signs  and the nursing notes.  Differential diagnosis includes, but is not limited to, UTI, vaginal candidiasis, viral exanthema  Patient's presentation is most consistent with acute complicated illness / injury requiring diagnostic workup.   Robyn Dunn is a 28 y.o., female presents today with symptoms of dysuria, frequency, vaginal discharge normal gross, white and itchy.  Patient states having rash with no fever.  Patient denies having pets or being outdoors. Patient's diagnosis is consistent with UTI, vaginal candidiasis, viral exanthem.  Labs are  reassuring. I did review the patient's allergies and medications.The patient is in stable and satisfactory condition for discharge home.  During admission patient received 1 doses of Keflex , loratadine, Pyridium for rash.  patient will be discharged home with prescriptions for Keflex , fluconazole, loratadine. Patient is to follow up with PCP as needed or otherwise directed. Patient is given ED precautions to return to the ED for any worsening or new symptoms. Discussed plan of care with patient, answered all of patient's questions, Patient agreeable to plan of care. Advised patient to take medications according to the instructions on the label. Discussed possible side effects of new medications. Patient verbalized understanding.   FINAL CLINICAL IMPRESSION(S) / ED DIAGNOSES   Final diagnoses:  Rash  Acute cystitis with hematuria  Vaginal candidiasis     Rx / DC Orders   ED Discharge Orders          Ordered    cephALEXin  (KEFLEX ) 500 MG capsule  3 times daily        05/15/24 2346    loratadine (CLARITIN) 10 MG tablet  Daily        05/15/24 2346    phenazopyridine (PYRIDIUM) 200 MG tablet  3 times daily PRN        05/15/24 2346             Note:  This document was prepared using Dragon voice recognition software and may include unintentional dictation errors.   Robyn Lennox, PA-C 05/15/24 2347    Collis Deaner, MD 05/16/24 0010

## 2024-05-15 NOTE — Discharge Instructions (Addendum)
 Have been diagnosed with urinary tract infection with hematuria, vaginal candidiasis, rash.  Please take plenty fluids, please take Keflex  1 capsule by mouth every 8 hours after main meals.  Please take loratadine 1 capsule by mouth daily for itchiness.  Please take Pyridium 1 tablet every 8 hours as needed for pain.  Please come back to ED or go to your PCP if you have new symptoms or symptoms worsen
